# Patient Record
Sex: Male | Born: 1950 | Race: Black or African American | Hispanic: No | Marital: Married | Smoking: Current every day smoker
Health system: Southern US, Community
[De-identification: ages and names within clinical notes are randomized; demographics above are authoritative.]

## PROBLEM LIST (undated history)

## (undated) DIAGNOSIS — J45909 Unspecified asthma, uncomplicated: Secondary | ICD-10-CM

## (undated) DIAGNOSIS — K219 Gastro-esophageal reflux disease without esophagitis: Secondary | ICD-10-CM

## (undated) DIAGNOSIS — H269 Unspecified cataract: Secondary | ICD-10-CM

## (undated) DIAGNOSIS — M109 Gout, unspecified: Secondary | ICD-10-CM

## (undated) DIAGNOSIS — E785 Hyperlipidemia, unspecified: Secondary | ICD-10-CM

## (undated) DIAGNOSIS — J449 Chronic obstructive pulmonary disease, unspecified: Secondary | ICD-10-CM

## (undated) DIAGNOSIS — E119 Type 2 diabetes mellitus without complications: Secondary | ICD-10-CM

## (undated) DIAGNOSIS — I10 Essential (primary) hypertension: Secondary | ICD-10-CM

## (undated) DIAGNOSIS — I219 Acute myocardial infarction, unspecified: Secondary | ICD-10-CM

## (undated) DIAGNOSIS — M1990 Unspecified osteoarthritis, unspecified site: Secondary | ICD-10-CM

## (undated) DIAGNOSIS — R0902 Hypoxemia: Secondary | ICD-10-CM

## (undated) DIAGNOSIS — I519 Heart disease, unspecified: Secondary | ICD-10-CM

## (undated) DIAGNOSIS — E1110 Type 2 diabetes mellitus with ketoacidosis without coma: Secondary | ICD-10-CM

## (undated) HISTORY — DX: Unspecified osteoarthritis, unspecified site: M19.90

## (undated) HISTORY — DX: Acute myocardial infarction, unspecified: I21.9

## (undated) HISTORY — DX: Type 2 diabetes mellitus with ketoacidosis without coma: E11.10

## (undated) HISTORY — DX: Unspecified asthma, uncomplicated: J45.909

## (undated) HISTORY — DX: Hypoxemia: R09.02

## (undated) HISTORY — DX: Heart disease, unspecified: I51.9

## (undated) HISTORY — PX: COLONOSCOPY: SHX174

## (undated) HISTORY — PX: OTHER SURGICAL HISTORY: SHX169

## (undated) HISTORY — DX: Type 2 diabetes mellitus without complications: E11.9

## (undated) HISTORY — DX: Unspecified cataract: H26.9

## (undated) HISTORY — PX: NO PAST SURGERIES: SHX2092

## (undated) HISTORY — DX: Hyperlipidemia, unspecified: E78.5

## (undated) HISTORY — DX: Gastro-esophageal reflux disease without esophagitis: K21.9

---

## 2013-02-17 HISTORY — PX: COLONOSCOPY: SHX174

## 2015-01-08 ENCOUNTER — Emergency Department (HOSPITAL_COMMUNITY): Payer: Medicaid Other

## 2015-01-08 ENCOUNTER — Encounter (HOSPITAL_COMMUNITY): Payer: Self-pay | Admitting: Nurse Practitioner

## 2015-01-08 ENCOUNTER — Inpatient Hospital Stay (HOSPITAL_COMMUNITY)
Admission: EM | Admit: 2015-01-08 | Discharge: 2015-01-11 | DRG: 638 | Disposition: A | Discharge status: Home Health | Payer: Medicaid Other | Attending: Internal Medicine | Admitting: Internal Medicine

## 2015-01-08 DIAGNOSIS — I10 Essential (primary) hypertension: Secondary | ICD-10-CM | POA: Diagnosis present

## 2015-01-08 DIAGNOSIS — R0902 Hypoxemia: Secondary | ICD-10-CM | POA: Diagnosis present

## 2015-01-08 DIAGNOSIS — J449 Chronic obstructive pulmonary disease, unspecified: Secondary | ICD-10-CM | POA: Diagnosis present

## 2015-01-08 DIAGNOSIS — Z7982 Long term (current) use of aspirin: Secondary | ICD-10-CM

## 2015-01-08 DIAGNOSIS — M109 Gout, unspecified: Secondary | ICD-10-CM | POA: Diagnosis present

## 2015-01-08 DIAGNOSIS — A599 Trichomoniasis, unspecified: Secondary | ICD-10-CM | POA: Diagnosis present

## 2015-01-08 DIAGNOSIS — E876 Hypokalemia: Secondary | ICD-10-CM | POA: Diagnosis not present

## 2015-01-08 DIAGNOSIS — E1310 Other specified diabetes mellitus with ketoacidosis without coma: Principal | ICD-10-CM | POA: Diagnosis present

## 2015-01-08 DIAGNOSIS — R079 Chest pain, unspecified: Secondary | ICD-10-CM | POA: Diagnosis present

## 2015-01-08 DIAGNOSIS — N179 Acute kidney failure, unspecified: Secondary | ICD-10-CM | POA: Diagnosis present

## 2015-01-08 DIAGNOSIS — K219 Gastro-esophageal reflux disease without esophagitis: Secondary | ICD-10-CM | POA: Insufficient documentation

## 2015-01-08 DIAGNOSIS — I129 Hypertensive chronic kidney disease with stage 1 through stage 4 chronic kidney disease, or unspecified chronic kidney disease: Secondary | ICD-10-CM | POA: Diagnosis present

## 2015-01-08 DIAGNOSIS — R0789 Other chest pain: Secondary | ICD-10-CM | POA: Diagnosis present

## 2015-01-08 DIAGNOSIS — J438 Other emphysema: Secondary | ICD-10-CM | POA: Diagnosis not present

## 2015-01-08 DIAGNOSIS — N189 Chronic kidney disease, unspecified: Secondary | ICD-10-CM | POA: Diagnosis present

## 2015-01-08 DIAGNOSIS — J42 Unspecified chronic bronchitis: Secondary | ICD-10-CM | POA: Diagnosis not present

## 2015-01-08 DIAGNOSIS — E1110 Type 2 diabetes mellitus with ketoacidosis without coma: Secondary | ICD-10-CM | POA: Diagnosis present

## 2015-01-08 DIAGNOSIS — F17210 Nicotine dependence, cigarettes, uncomplicated: Secondary | ICD-10-CM | POA: Diagnosis present

## 2015-01-08 DIAGNOSIS — R109 Unspecified abdominal pain: Secondary | ICD-10-CM

## 2015-01-08 DIAGNOSIS — N178 Other acute kidney failure: Secondary | ICD-10-CM | POA: Diagnosis not present

## 2015-01-08 HISTORY — DX: Gastro-esophageal reflux disease without esophagitis: K21.9

## 2015-01-08 HISTORY — DX: Chronic obstructive pulmonary disease, unspecified: J44.9

## 2015-01-08 HISTORY — DX: Essential (primary) hypertension: I10

## 2015-01-08 HISTORY — DX: Acute kidney failure, unspecified: N17.9

## 2015-01-08 HISTORY — DX: Gout, unspecified: M10.9

## 2015-01-08 LAB — URINALYSIS, ROUTINE W REFLEX MICROSCOPIC
Glucose, UA: >1000 mg/dL — AB
Hgb urine dipstick: NEGATIVE
Ketones, ur: >80 mg/dL — AB
Nitrite: NEGATIVE
Protein, ur: NEGATIVE mg/dL
Specific Gravity, Urine: 1.022 (ref 1.005–1.030)
Urobilinogen, UA: 1 mg/dL (ref 0.0–1.0)
pH: 5 (ref 5.0–8.0)

## 2015-01-08 LAB — URINE MICROSCOPIC-ADD ON

## 2015-01-08 LAB — I-STAT VENOUS BLOOD GAS, ED
Acid-base deficit: 11 mmol/L — ABNORMAL HIGH (ref 0.0–2.0)
Bicarbonate: 14.8 mEq/L — ABNORMAL LOW (ref 20.0–24.0)
O2 Saturation: 74 %
TCO2: 16 mmol/L (ref 0–100)
pCO2, Ven: 32.7 mmHg — ABNORMAL LOW (ref 45.0–50.0)
pH, Ven: 7.262 (ref 7.250–7.300)
pO2, Ven: 45 mmHg (ref 30.0–45.0)

## 2015-01-08 LAB — I-STAT TROPONIN, ED: TROPONIN I, POC: 0 ng/mL (ref 0.00–0.08)

## 2015-01-08 LAB — BASIC METABOLIC PANEL
ANION GAP: 25 — AB (ref 5–15)
BUN: 27 mg/dL — AB (ref 6–20)
CO2: 12 mmol/L — ABNORMAL LOW (ref 22–32)
Calcium: 8.8 mg/dL — ABNORMAL LOW (ref 8.9–10.3)
Chloride: 94 mmol/L — ABNORMAL LOW (ref 101–111)
Creatinine, Ser: 1.86 mg/dL — ABNORMAL HIGH (ref 0.61–1.24)
GFR, EST AFRICAN AMERICAN: 43 mL/min — AB (ref >60)
GFR, EST NON AFRICAN AMERICAN: 37 mL/min — AB (ref >60)
GLUCOSE: 441 mg/dL — AB (ref 65–99)
Potassium: 4.4 mmol/L (ref 3.5–5.1)
Sodium: 131 mmol/L — ABNORMAL LOW (ref 135–145)

## 2015-01-08 LAB — GLUCOSE, CAPILLARY: Glucose-Capillary: 413 mg/dL — ABNORMAL HIGH (ref 65–99)

## 2015-01-08 LAB — CBC
HEMATOCRIT: 50 % (ref 39.0–52.0)
Hemoglobin: 17.1 g/dL — ABNORMAL HIGH (ref 13.0–17.0)
MCH: 28.1 pg (ref 26.0–34.0)
MCHC: 34.2 g/dL (ref 30.0–36.0)
MCV: 82.1 fL (ref 78.0–100.0)
Platelets: 113 10*3/uL — ABNORMAL LOW (ref 150–400)
RBC: 6.09 MIL/uL — ABNORMAL HIGH (ref 4.22–5.81)
RDW: 14.2 % (ref 11.5–15.5)
WBC: 7.7 10*3/uL (ref 4.0–10.5)

## 2015-01-08 LAB — CBG MONITORING, ED
GLUCOSE-CAPILLARY: 407 mg/dL — AB (ref 65–99)
Glucose-Capillary: 459 mg/dL — ABNORMAL HIGH (ref 65–99)

## 2015-01-08 LAB — BRAIN NATRIURETIC PEPTIDE: B NATRIURETIC PEPTIDE 5: 17.1 pg/mL (ref 0.0–100.0)

## 2015-01-08 MED ORDER — DEXTROSE-NACL 5-0.45 % IV SOLN
INTRAVENOUS | Status: DC
  Administered 2015-01-09: 01:00:00 via INTRAVENOUS

## 2015-01-08 MED ORDER — ASPIRIN 325 MG PO TABS
325.0000 mg | ORAL_TABLET | Freq: Every day | ORAL | Status: DC
Start: 2015-01-08 — End: 2015-01-11
  Administered 2015-01-09 – 2015-01-11 (×4): 325 mg via ORAL
  Filled 2015-01-08 (×4): qty 1

## 2015-01-08 MED ORDER — PANTOPRAZOLE SODIUM 40 MG PO TBEC
40.0000 mg | DELAYED_RELEASE_TABLET | Freq: Every day | ORAL | Status: DC
  Administered 2015-01-09 – 2015-01-11 (×3): 40 mg via ORAL
  Filled 2015-01-08 (×4): qty 1

## 2015-01-08 MED ORDER — SODIUM CHLORIDE 0.9 % IV SOLN
INTRAVENOUS | Status: DC
  Administered 2015-01-08: 4 [IU]/h via INTRAVENOUS
  Filled 2015-01-08: qty 2.5

## 2015-01-08 MED ORDER — DEXTROSE-NACL 5-0.45 % IV SOLN: INTRAVENOUS | Status: DC

## 2015-01-08 MED ORDER — NITROGLYCERIN 0.4 MG SL SUBL: 0.4000 mg | SUBLINGUAL_TABLET | SUBLINGUAL | Status: DC | PRN

## 2015-01-08 MED ORDER — SODIUM CHLORIDE 0.9 % IV SOLN: INTRAVENOUS | Status: DC

## 2015-01-08 MED ORDER — COLCHICINE 0.6 MG PO TABS
0.6000 mg | ORAL_TABLET | Freq: Every day | ORAL | Status: DC | PRN
  Filled 2015-01-08: qty 1

## 2015-01-08 MED ORDER — ENOXAPARIN SODIUM 40 MG/0.4ML ~~LOC~~ SOLN
40.0000 mg | Freq: Every day | SUBCUTANEOUS | Status: DC
  Administered 2015-01-09 – 2015-01-11 (×3): 40 mg via SUBCUTANEOUS
  Filled 2015-01-08 (×3): qty 0.4

## 2015-01-08 MED ORDER — MOMETASONE FURO-FORMOTEROL FUM 100-5 MCG/ACT IN AERO
2.0000 | INHALATION_SPRAY | Freq: Two times a day (BID) | RESPIRATORY_TRACT | Status: DC
  Administered 2015-01-09 – 2015-01-11 (×4): 2 via RESPIRATORY_TRACT
  Filled 2015-01-08 (×2): qty 8.8

## 2015-01-08 MED ORDER — SODIUM CHLORIDE 0.9 % IV SOLN
INTRAVENOUS | Status: DC
  Administered 2015-01-08 – 2015-01-10 (×3): via INTRAVENOUS

## 2015-01-08 MED ORDER — POTASSIUM CHLORIDE 10 MEQ/100ML IV SOLN
10.0000 meq | INTRAVENOUS | Status: AC
  Administered 2015-01-09 (×4): 10 meq via INTRAVENOUS
  Filled 2015-01-08 (×4): qty 100

## 2015-01-08 NOTE — ED Provider Notes (Signed)
CSN: [id number]     Arrival date & time 01/08/15  1704 History   First MD Initiated Contact with Patient 01/08/15 2000     Chief Complaint  Patient presents with  . Chest Pain     (Consider location/radiation/quality/duration/timing/severity/associated sxs/prior Treatment) HPI Patient presents to the emergency department with weakness, abdominal pain and chest discomfort over the past couple of weeks.  The patient states that he has been getting weaker and weaker. he states she has also had a lot of increased thirst and increased urination.  Patient states that nothing seems make his condition, better or worse.  The patient states that he does not have any chest pain, shortness of breath, nausea, vomiting, diarrhea, headache, blurred vision, back pain, neck pain, dysuria, incontinence, rash, or syncope.  The patient states that he does not have any history of diabetes Past Medical History  Diagnosis Date  . Acid reflux   . Hypertension    History reviewed. No pertinent past surgical history. History reviewed. No pertinent family history. History  Substance Use Topics  . Smoking status: Current Every Day Smoker    Types: Cigarettes  . Smokeless tobacco: Not on file  . Alcohol Use: Yes    Review of Systems  All other systems negative except as documented in the HPI. All pertinent positives and negatives as reviewed in the HPI.  Allergies  Review of patient's allergies indicates no known allergies.  Home Medications   Prior to Admission medications   Not on File   BP 134/68 mmHg  Pulse 64  Temp(Src) 97.3 F (36.3 C) (Oral)  Resp 20  SpO2 99% Physical Exam  Constitutional: He is oriented to person, place, and time. He appears well-developed and well-nourished. No distress.  HENT:  Head: Normocephalic and atraumatic.  Mouth/Throat: Mucous membranes are dry.  Eyes: Pupils are equal, round, and reactive to light.  Cardiovascular: Normal rate, regular rhythm and normal  heart sounds.  Exam reveals no gallop and no friction rub.   No murmur heard. Pulmonary/Chest: Effort normal and breath sounds normal. No respiratory distress. He has no wheezes.  Abdominal: Soft. Bowel sounds are normal. He exhibits no distension. There is no tenderness.  Neurological: He is alert and oriented to person, place, and time. He exhibits normal muscle tone. Coordination normal.  Skin: Skin is warm and dry. No rash noted. No erythema.  Nursing note and vitals reviewed.   ED Course  Procedures (including critical care time) Labs Review Labs Reviewed  BASIC METABOLIC PANEL - Abnormal; Notable for the following:    Sodium 131 (*)    Chloride 94 (*)    CO2 12 (*)    Glucose, Bld 441 (*)    BUN 27 (*)    Creatinine, Ser 1.86 (*)    Calcium 8.8 (*)    GFR calc non Af Amer 37 (*)    GFR calc Af Amer 43 (*)    Anion gap 25 (*)    All other components within normal limits  CBC - Abnormal; Notable for the following:    RBC 6.09 (*)    Hemoglobin 17.1 (*)    Platelets 113 (*)    All other components within normal limits  BRAIN NATRIURETIC PEPTIDE  I-STAT TROPOININ, ED    Imaging Review Dg Chest 2 View  01/08/2015   CLINICAL DATA:  Midline chest pain and shortness of breath for 1 1/2 weeks.  EXAM: CHEST  2 VIEW  COMPARISON:  None.  FINDINGS: The heart  is normal in size. There is mild tortuosity of the thoracic aorta. The pulmonary hila appear normal. There are chronic appearing bronchitic type interstitial lung changes and probable bibasilar scarring or subsegmental atelectasis. No infiltrates, edema or effusions. The bony thorax is intact.  IMPRESSION: Chronic appearing bronchitic type lung changes and bibasilar scarring or atelectasis. No infiltrates or effusions.   Electronically Signed   By: Rudie MeyerP.  Gallerani M.D.   On: 01/08/2015 18:09     EKG Interpretation None      Patient has diabetic ketoacidosis.  The patient will be added to the hospital for further evaluation and  care.  Glucose stabilizer was started along with IV fluids.  Patient is rechecked [address]    Charlestine NightChristopher Zayaan Kozak, PA-C 01/11/15 1351  Gilda Creasehristopher J Pollina, MD 01/14/15 1414

## 2015-01-08 NOTE — ED Notes (Signed)
Kakrakandy, MD at bedside 

## 2015-01-08 NOTE — ED Notes (Signed)
Attempted report 

## 2015-01-08 NOTE — ED Notes (Addendum)
CBG 407 

## 2015-01-08 NOTE — H&P (Addendum)
[facility] History and Physical  Previn Flinn ZOX:[id number] DOB: 1950/12/30 DOA: 01/08/2015  Referring physician: Mr.Lawyer. PCP: No primary care provider on file. Dr.Campbell in [place]. Specialists: None.  Chief Complaint: Weakness and chest pain.  HPI: Scott Figueroa is a 64 y.o. male with history of COPD, ongoing tobacco abuse, hypertension and gout presents to the ER because of weakness and chest pain. Patient states over the last 1 week patient has been feeling with a week and has been having some exertional chest pain. Chest pain is retrosternal and pressure-like and increases on walking. Patient states he has chronic shortness of breath. Patient states he also has been having some epigastric discomfort with nausea and poor oral intake over the last 1 week. In the ER patient's initial metabolic panel shows elevated anion gap with blood sugar consistent with DKA. Patient has been admitted for further management of DKA chest pain. Patient is nonfocal on exam.   Review of Systems: As presented in the history of presenting illness, rest negative.  Past Medical History  Diagnosis Date  . Acid reflux   . Hypertension   . COPD (chronic obstructive pulmonary disease)   . Gout    Past Surgical History  Procedure Laterality Date  . No past surgeries     Social History:  reports that he has been smoking Cigarettes.  He does not have any smokeless tobacco history on file. He reports that he drinks alcohol. He reports that he does not use illicit drugs. Where does patient live home. Can patient participate in ADLs? Yes.  No Known Allergies  Family History:  Family History  Problem Relation Age of Onset  . Diabetes Mellitus II Mother   . Hypertension Mother   . Heart failure Father   . Hypertension Father       Prior to Admission medications   Medication Sig Start Date End Date Taking? Authorizing Provider  benazepril-hydrochlorthiazide (LOTENSIN HCT) 20-25 MG per tablet  Take 1 tablet by mouth daily.   Yes Historical Provider, MD  colchicine 0.6 MG tablet Take 0.6 mg by mouth daily as needed (gout).   Yes Historical Provider, MD  Fluticasone-Salmeterol (ADVAIR) 250-50 MCG/DOSE AEPB Inhale 1 puff into the lungs 2 (two) times daily.   Yes Historical Provider, MD  omeprazole (PRILOSEC) 20 MG capsule Take 20 mg by mouth daily.   Yes Historical Provider, MD    Physical Exam: Filed Vitals:   01/08/15 2130 01/08/15 2145 01/08/15 2215 01/08/15 2230  BP: 132/65 132/66 134/67 152/60  Pulse: 67 68 80 81  Temp:      TempSrc:      Resp: Height:      Weight:      SpO2: 100% 98% 98% 94%     General:  Moderately built and nourished.  Eyes: Anicteric no pallor.  ENT: No discharge from the ears eyes nose and mouth.  Neck: No mass felt. No JVD appreciated.  Cardiovascular: S1 and S2 heard.  Respiratory: No rhonchi or crepitations.  Abdomen: Mild epigastric tenderness no guarding or rigidity.  Skin: No rash.  Musculoskeletal: No edema.  Psychiatric: Appears normal.  Neurologic: Alert awake oriented to time place and person. Moves all extremities.  Labs on Admission:  Basic Metabolic Panel:  Recent Labs Lab 01/08/15 1719  NA 131*  K 4.4  CL 94*  CO2 12*  GLUCOSE 441*  BUN 27*  CREATININE 1.86*  CALCIUM 8.8*   Liver Function Tests: No results for input(s): AST,  ALT, ALKPHOS, BILITOT, PROT, ALBUMIN in the last 168 hours. No results for input(s): LIPASE, AMYLASE in the last 168 hours. No results for input(s): AMMONIA in the last 168 hours. CBC:  Recent Labs Lab 01/08/15 1719  WBC 7.7  HGB 17.1*  HCT 50.0  MCV 82.1  PLT 113*   Cardiac Enzymes: No results for input(s): CKTOTAL, CKMB, CKMBINDEX, TROPONINI in the last 168 hours.  BNP (last 3 results)  Recent Labs  01/08/15 1719  BNP 17.1    ProBNP (last 3 results) No results for input(s): PROBNP in the last 8760 hours.  CBG:  Recent Labs Lab 01/08/15 2053  01/08/15 2212  GLUCAP 459* 407*    Radiological Exams on Admission: Dg Chest 2 View  01/08/2015   CLINICAL DATA:  Midline chest pain and shortness of breath for 1 1/2 weeks.  EXAM: CHEST  2 VIEW  COMPARISON:  None.  FINDINGS: The heart is normal in size. There is mild tortuosity of the thoracic aorta. The pulmonary hila appear normal. There are chronic appearing bronchitic type interstitial lung changes and probable bibasilar scarring or subsegmental atelectasis. No infiltrates, edema or effusions. The bony thorax is intact.  IMPRESSION: Chronic appearing bronchitic type lung changes and bibasilar scarring or atelectasis. No infiltrates or effusions.   Electronically Signed   By: Rudie MeyerP.  Gallerani M.D.   On: 01/08/2015 18:09    EKG: Independently reviewed. Normal sinus rhythm with prolonged QT and LVH.  Assessment/Plan Active Problems:   DKA (diabetic ketoacidoses)   Chest pain   ARF (acute renal failure)   COPD (chronic obstructive pulmonary disease)   Hypertension   DKA, type 2   1. Diabetic ketoacidosis probably type 2 diabetes - patient has been started on IV insulin infusion and IV fluids. Change to long-acting insulin subcutaneous once anion gap is corrected. Closely follow metabolic panel. DKA probably secondary to new onset diabetes. 2. Chest pain with exertional symptoms concerning for angina - cycle cardiac markers and check 2-D echo. Aspirin. 3. Abdominal pain - check LFTs and lipase and sonogram of the abdomen. 4. Renal failure probably acute on chronic - baseline creatinine not known. At this time we will hold off ARB's and HCTZ. Gently hydrate him closely follow metabolic panel. 5. Hypertension - since patient is nothing by mouth and secondary to #4 were holding off patient's ARB and HCTZ and patient has been placed on when necessary IV hydralazine. Closely follow blood pressure trends. 6. COPD - continue home inhalers. 7. History of gout on allopurinol. 8. Tobacco abuse -  patient advised to quit smoking. 9. Trichomoniasis in the urine - Flagyl has been ordered and patient advised will need treatment for his partner. Follow urine culture since there is a lot of WBC in the urine.   DVT Prophylaxis Lovenox.  Code Status: Full code.  Family Communication: Patient's sister at the bedside.  Disposition Plan: Admit to inpatient.    KAKRAKANDY,ARSHAD N. [facility] Pager [phone].  If 7PM-7AM, please contact night-coverage [email or web address] Password [facility] 01/08/2015, 11:08 PM

## 2015-01-08 NOTE — ED Notes (Signed)
He states hes been weak and had CP and abd pain this week. He went to his doctor earlier this week and was started on medication for acid reflux but states his pain persists and he continues to feel bad. He reports nausea, sob also. ghe is A&ox4, breathing easily now

## 2015-01-09 ENCOUNTER — Inpatient Hospital Stay (HOSPITAL_COMMUNITY): Payer: Medicaid Other

## 2015-01-09 DIAGNOSIS — E1310 Other specified diabetes mellitus with ketoacidosis without coma: Principal | ICD-10-CM

## 2015-01-09 DIAGNOSIS — N178 Other acute kidney failure: Secondary | ICD-10-CM

## 2015-01-09 DIAGNOSIS — I10 Essential (primary) hypertension: Secondary | ICD-10-CM

## 2015-01-09 DIAGNOSIS — R079 Chest pain, unspecified: Secondary | ICD-10-CM

## 2015-01-09 LAB — GLUCOSE, CAPILLARY
GLUCOSE-CAPILLARY: 170 mg/dL — AB (ref 65–99)
GLUCOSE-CAPILLARY: 171 mg/dL — AB (ref 65–99)
GLUCOSE-CAPILLARY: 159 mg/dL — AB (ref 65–99)
GLUCOSE-CAPILLARY: 159 mg/dL — AB (ref 65–99)
GLUCOSE-CAPILLARY: 106 mg/dL — AB (ref 65–99)
GLUCOSE-CAPILLARY: 235 mg/dL — AB (ref 65–99)
GLUCOSE-CAPILLARY: 170 mg/dL — AB (ref 65–99)
Glucose-Capillary: 169 mg/dL — ABNORMAL HIGH (ref 65–99)
Glucose-Capillary: 182 mg/dL — ABNORMAL HIGH (ref 65–99)
Glucose-Capillary: 189 mg/dL — ABNORMAL HIGH (ref 65–99)
Glucose-Capillary: 176 mg/dL — ABNORMAL HIGH (ref 65–99)
Glucose-Capillary: 179 mg/dL — ABNORMAL HIGH (ref 65–99)
Glucose-Capillary: 147 mg/dL — ABNORMAL HIGH (ref 65–99)
Glucose-Capillary: 122 mg/dL — ABNORMAL HIGH (ref 65–99)
Glucose-Capillary: 91 mg/dL (ref 65–99)
Glucose-Capillary: 135 mg/dL — ABNORMAL HIGH (ref 65–99)
Glucose-Capillary: 199 mg/dL — ABNORMAL HIGH (ref 65–99)
Glucose-Capillary: 231 mg/dL — ABNORMAL HIGH (ref 65–99)
Glucose-Capillary: 218 mg/dL — ABNORMAL HIGH (ref 65–99)

## 2015-01-09 LAB — HEPATIC FUNCTION PANEL
ALBUMIN: 3.7 g/dL (ref 3.5–5.0)
ALK PHOS: 146 U/L — AB (ref 38–126)
ALT: 69 U/L — ABNORMAL HIGH (ref 17–63)
AST: 57 U/L — ABNORMAL HIGH (ref 15–41)
BILIRUBIN DIRECT: 0.3 mg/dL (ref 0.1–0.5)
BILIRUBIN TOTAL: 1.6 mg/dL — AB (ref 0.3–1.2)
Indirect Bilirubin: 1.3 mg/dL — ABNORMAL HIGH (ref 0.3–0.9)
Total Protein: 7.5 g/dL (ref 6.5–8.1)

## 2015-01-09 LAB — CBC WITH DIFFERENTIAL/PLATELET
Basophils Absolute: 0 10*3/uL (ref 0.0–0.1)
Basophils Relative: 0 % (ref 0–1)
EOS ABS: 0.1 10*3/uL (ref 0.0–0.7)
EOS PCT: 1 % (ref 0–5)
HEMATOCRIT: 46.3 % (ref 39.0–52.0)
Hemoglobin: 16 g/dL (ref 13.0–17.0)
LYMPHS PCT: 38 % (ref 12–46)
Lymphs Abs: 3.4 10*3/uL (ref 0.7–4.0)
MCH: 28.1 pg (ref 26.0–34.0)
MCHC: 34.6 g/dL (ref 30.0–36.0)
MCV: 81.2 fL (ref 78.0–100.0)
Monocytes Absolute: 0.9 10*3/uL (ref 0.1–1.0)
Monocytes Relative: 10 % (ref 3–12)
Neutro Abs: 4.4 10*3/uL (ref 1.7–7.7)
Neutrophils Relative %: 50 % (ref 43–77)
PLATELETS: 111 10*3/uL — AB (ref 150–400)
RBC: 5.7 MIL/uL (ref 4.22–5.81)
RDW: 14.1 % (ref 11.5–15.5)
WBC: 8.8 10*3/uL (ref 4.0–10.5)

## 2015-01-09 LAB — BASIC METABOLIC PANEL
ANION GAP: 20 — AB (ref 5–15)
Anion gap: 25 — ABNORMAL HIGH (ref 5–15)
Anion gap: 17 — ABNORMAL HIGH (ref 5–15)
Anion gap: 16 — ABNORMAL HIGH (ref 5–15)
Anion gap: 14 (ref 5–15)
Anion gap: 14 (ref 5–15)
BUN: 14 mg/dL (ref 6–20)
BUN: 18 mg/dL (ref 6–20)
BUN: 18 mg/dL (ref 6–20)
BUN: 22 mg/dL — AB (ref 6–20)
BUN: 26 mg/dL — ABNORMAL HIGH (ref 6–20)
BUN: 28 mg/dL — AB (ref 6–20)
CALCIUM: 8.9 mg/dL (ref 8.9–10.3)
CHLORIDE: 97 mmol/L — AB (ref 101–111)
CHLORIDE: 100 mmol/L — AB (ref 101–111)
CO2: 13 mmol/L — ABNORMAL LOW (ref 22–32)
CO2: 16 mmol/L — ABNORMAL LOW (ref 22–32)
CO2: 19 mmol/L — ABNORMAL LOW (ref 22–32)
CO2: 19 mmol/L — ABNORMAL LOW (ref 22–32)
CO2: 20 mmol/L — AB (ref 22–32)
CO2: 20 mmol/L — ABNORMAL LOW (ref 22–32)
CREATININE: 1.24 mg/dL (ref 0.61–1.24)
CREATININE: 1.75 mg/dL — AB (ref 0.61–1.24)
Calcium: 9.4 mg/dL (ref 8.9–10.3)
Calcium: 8.9 mg/dL (ref 8.9–10.3)
Calcium: 8.9 mg/dL (ref 8.9–10.3)
Calcium: 8.6 mg/dL — ABNORMAL LOW (ref 8.9–10.3)
Calcium: 8.7 mg/dL — ABNORMAL LOW (ref 8.9–10.3)
Chloride: 97 mmol/L — ABNORMAL LOW (ref 101–111)
Chloride: 99 mmol/L — ABNORMAL LOW (ref 101–111)
Chloride: 96 mmol/L — ABNORMAL LOW (ref 101–111)
Chloride: 97 mmol/L — ABNORMAL LOW (ref 101–111)
Creatinine, Ser: 1.67 mg/dL — ABNORMAL HIGH (ref 0.61–1.24)
Creatinine, Ser: 1.35 mg/dL — ABNORMAL HIGH (ref 0.61–1.24)
Creatinine, Ser: 1.32 mg/dL — ABNORMAL HIGH (ref 0.61–1.24)
Creatinine, Ser: 1.22 mg/dL (ref 0.61–1.24)
GFR calc Af Amer: 49 mL/min — ABNORMAL LOW (ref >60)
GFR calc Af Amer: >60 mL/min (ref >60)
GFR calc Af Amer: >60 mL/min (ref >60)
GFR calc Af Amer: >60 mL/min (ref >60)
GFR calc non Af Amer: 42 mL/min — ABNORMAL LOW (ref >60)
GFR calc non Af Amer: 56 mL/min — ABNORMAL LOW (ref >60)
GFR calc non Af Amer: >60 mL/min (ref >60)
GFR, EST AFRICAN AMERICAN: 46 mL/min — AB (ref >60)
GFR, EST NON AFRICAN AMERICAN: 40 mL/min — AB (ref >60)
GFR, EST NON AFRICAN AMERICAN: 55 mL/min — AB (ref >60)
GLUCOSE: 173 mg/dL — AB (ref 65–99)
GLUCOSE: 120 mg/dL — AB (ref 65–99)
GLUCOSE: 239 mg/dL — AB (ref 65–99)
GLUCOSE: 190 mg/dL — AB (ref 65–99)
Glucose, Bld: 230 mg/dL — ABNORMAL HIGH (ref 65–99)
Glucose, Bld: 164 mg/dL — ABNORMAL HIGH (ref 65–99)
POTASSIUM: 3.5 mmol/L (ref 3.5–5.1)
POTASSIUM: 3.6 mmol/L (ref 3.5–5.1)
POTASSIUM: 3.8 mmol/L (ref 3.5–5.1)
POTASSIUM: 4 mmol/L (ref 3.5–5.1)
Potassium: 3.7 mmol/L (ref 3.5–5.1)
Potassium: 3.4 mmol/L — ABNORMAL LOW (ref 3.5–5.1)
SODIUM: 131 mmol/L — AB (ref 135–145)
Sodium: 135 mmol/L (ref 135–145)
Sodium: 133 mmol/L — ABNORMAL LOW (ref 135–145)
Sodium: 135 mmol/L (ref 135–145)
Sodium: 135 mmol/L (ref 135–145)
Sodium: 130 mmol/L — ABNORMAL LOW (ref 135–145)

## 2015-01-09 LAB — LIPASE, BLOOD: LIPASE: 58 U/L — AB (ref 22–51)

## 2015-01-09 LAB — RAPID URINE DRUG SCREEN, HOSP PERFORMED
Amphetamines: NOT DETECTED
BENZODIAZEPINES: NOT DETECTED
Barbiturates: NOT DETECTED
COCAINE: NOT DETECTED
Opiates: NOT DETECTED
TETRAHYDROCANNABINOL: NOT DETECTED

## 2015-01-09 LAB — MAGNESIUM: Magnesium: 1.9 mg/dL (ref 1.7–2.4)

## 2015-01-09 LAB — TROPONIN I
Troponin I: <0.03 ng/mL (ref <0.031)
Troponin I: <0.03 ng/mL (ref <0.031)
Troponin I: 0.03 ng/mL (ref <0.031)

## 2015-01-09 LAB — CK: Total CK: 110 U/L (ref 49–397)

## 2015-01-09 LAB — MRSA PCR SCREENING: MRSA BY PCR: NEGATIVE

## 2015-01-09 MED ORDER — HYDRALAZINE HCL 20 MG/ML IJ SOLN: 10.0000 mg | INTRAMUSCULAR | Status: DC | PRN

## 2015-01-09 MED ORDER — LIVING WELL WITH DIABETES BOOK
Freq: Once | Status: AC
  Administered 2015-01-09: 1
  Filled 2015-01-09 (×2): qty 1

## 2015-01-09 MED ORDER — ALBUTEROL SULFATE (2.5 MG/3ML) 0.083% IN NEBU: 2.5000 mg | INHALATION_SOLUTION | Freq: Four times a day (QID) | RESPIRATORY_TRACT | Status: DC | PRN

## 2015-01-09 MED ORDER — ZOLPIDEM TARTRATE 5 MG PO TABS
5.0000 mg | ORAL_TABLET | Freq: Every evening | ORAL | Status: DC | PRN
  Administered 2015-01-09 – 2015-01-10 (×3): 5 mg via ORAL
  Filled 2015-01-09 (×3): qty 1

## 2015-01-09 MED ORDER — POTASSIUM CHLORIDE 10 MEQ/100ML IV SOLN
10.0000 meq | INTRAVENOUS | Status: AC
  Administered 2015-01-09 (×3): 10 meq via INTRAVENOUS
  Filled 2015-01-09: qty 100

## 2015-01-09 MED ORDER — POTASSIUM CHLORIDE CRYS ER 20 MEQ PO TBCR
40.0000 meq | EXTENDED_RELEASE_TABLET | Freq: Once | ORAL | Status: AC
Start: 2015-01-09 — End: 2015-01-09
  Administered 2015-01-09: 40 meq via ORAL
  Filled 2015-01-09: qty 2

## 2015-01-09 MED ORDER — METRONIDAZOLE IN NACL 5-0.79 MG/ML-% IV SOLN
500.0000 mg | Freq: Three times a day (TID) | INTRAVENOUS | Status: DC
Start: 2015-01-09 — End: 2015-01-10
  Administered 2015-01-09 – 2015-01-10 (×4): 500 mg via INTRAVENOUS
  Filled 2015-01-09 (×6): qty 100

## 2015-01-09 NOTE — Progress Notes (Signed)
  Echocardiogram 2D Echocardiogram has been performed.  Arvil ChacoFoster, Loy Mccartt 01/09/2015, 2:57 PM

## 2015-01-09 NOTE — Progress Notes (Signed)
Paged by nurse, patient had a 7 runs of nonsustained V. tach at 13:05. He was sleeping in his bed, asymptomatic. Supplement given for low potassium. Pending magnesium level. we will continue to monitor.

## 2015-01-09 NOTE — Progress Notes (Signed)
Utilization review completed. Tayquan Gassman, RN, BSN. 

## 2015-01-09 NOTE — Consult Note (Signed)
CARDIOLOGY CONSULT NOTE   Patient Scott Figueroa MRN: [id number] DOB/AGE: [birth year] 64 y.o.  Admit date: 01/08/2015  Primary Physician   Dr.Campbell in [place]. Primary Cardiologist   New (Dr. Herbie [place]) Reason for Consultation   Chest pain  HPI: Scott Figueroa is a 64 y.o. male with a history of HTN, COPD, ongoing tobacco abuse and gout presented to the [facility] ER 6/28 for worsening weakness and chest/abdominal pain.   Patient states over the last 1 month or so has been feeling with a week, recently worsen over last 1 week. He went to his doctor earlier this week for abdominal pain and was started on medication for acid reflux but states his pain persists and he continues to feel bad. He reports nausea and vomiting. He states he lost about 30lb in past 2 months. On presented to ED and IM he reported chest pain is retrosternal and pressure-like and increases on walking. However he told today today that he has not chest pain. He was complaining of abdominal pain has been worsen recently and describes this as burning achy pain over epigastric area. No radiation of pain. Nothing makes better or worse. His appetite has been decrease. He is complaining of exertional sob. He denies orthopnea, PND, LE swelling or claudication symptoms.   In the ER patient's initial metabolic panel shows elevated anion gap with blood sugar consistent with DKA. Patient has been admitted for further management of DKA. Cardiology is consulted for evaluation of cardiac etiology of his symptoms.    Past Medical History  Diagnosis Date  . Acid reflux   . Hypertension   . COPD (chronic obstructive pulmonary disease)   . Gout      Past Surgical History  Procedure Laterality Date  . No past surgeries      No Known Allergies  I have reviewed the patient's current medications . aspirin  325 mg Oral Daily  . enoxaparin (LOVENOX) injection  40 mg Subcutaneous Daily  . metronidazole  500 mg Intravenous 3 times per day   . mometasone-formoterol  2 puff Inhalation BID  . pantoprazole  40 mg Oral Daily  . potassium chloride  10 mEq Intravenous Q1 Hr x 3   . sodium chloride 150 mL/hr at 01/08/15 2337  . dextrose 5 % and 0.45% NaCl 125 mL/hr at 01/09/15 0048  . insulin (NOVOLIN-R) infusion 3.8 Units/hr (01/09/15 0651)   albuterol, colchicine, hydrALAZINE, nitroGLYCERIN, zolpidem  Prior to Admission medications   Medication Sig Start Date End Date Taking? Authorizing Provider  benazepril-hydrochlorthiazide (LOTENSIN HCT) 20-25 MG per tablet Take 1 tablet by mouth daily.   Yes Historical Provider, MD  colchicine 0.6 MG tablet Take 0.6 mg by mouth daily as needed (gout).   Yes Historical Provider, MD  Fluticasone-Salmeterol (ADVAIR) 250-50 MCG/DOSE AEPB Inhale 1 puff into the lungs 2 (two) times daily.   Yes Historical Provider, MD  omeprazole (PRILOSEC) 20 MG capsule Take 20 mg by mouth daily.   Yes Historical Provider, MD     History   Social History  . Marital Status: Married    Spouse Name: N/A  . Number of Children: N/A  . Years of Education: N/A   Occupational History  . Not on file.   Social History Main Topics  . Smoking status: Current Every Day Smoker    Types: Cigarettes  . Smokeless tobacco: Not on file  . Alcohol Use: Yes  . Drug Use: No  . Sexual Activity: Not on file   Other  Topics Concern  . Not on file   Social History Narrative  . No narrative on file    No family status information on file.   Family History  Problem Relation Age of Onset  . Diabetes Mellitus II Mother   . Hypertension Mother   . Heart failure Father   . Hypertension Father      ROS:  Full 14 point review of systems complete and found to be negative unless listed above.  Physical Exam: Blood pressure 122/78, pulse 70, temperature 97.9 F (36.6 C), temperature source Oral, resp. rate 20, height 5\' 11"  (1.803 m), weight 219 lb 2.2 oz (99.4 kg), SpO2 98 %.  General: ill apperaring, male in no acute  distress Head: Eyes PERRLA, No xanthomas. Normocephalic and atraumatic, oropharynx without edema or exudate.  Lungs: Resp regular and unlabored, CTA. Heart: RRR no s3, s4, or murmurs..   Neck: No carotid bruits. No lymphadenopathy.  No JVD. Abdomen: Bowel sounds present, abdomen soft and diffuse TTP.  Msk:  No spine or cva tenderness.  No joint deformities or effusions. Extremities: No clubbing, cyanosis or edema. DP/PT/Radials 2+ and equal bilaterally. Neuro: Alert and oriented X 3. No focal deficits noted. Psych:  Good affect, responds appropriately Skin: No rashes or lesions noted.  Labs:   Lab Results  Component Value Date   WBC 8.8 01/09/2015   HGB 16.0 01/09/2015   HCT 46.3 01/09/2015   MCV 81.2 01/09/2015   PLT 111* 01/09/2015   No results for input(s): INR in the last 72 hours.  Recent Labs Lab 01/09/15 0036  01/09/15 0800  NA 135  < > 135  K 4.0  < > 3.5  CL 97*  < > 99*  CO2 13*  < > 19*  BUN 28*  < > 22*  CREATININE 1.75*  < > 1.35*  CALCIUM 9.4  < > 8.9  PROT 7.5  --   --   BILITOT 1.6*  --   --   ALKPHOS 146*  --   --   ALT 69*  --   --   AST 57*  --   --   GLUCOSE 230*  < > 173*  ALBUMIN 3.7  --   --   < > = values in this interval not displayed. No results found for: MG  Recent Labs  01/09/15 0036 01/09/15 0528  CKTOTAL 110  --   TROPONINI <0.03 <0.03    Recent Labs  01/08/15 1726  TROPIPOC 0.00    LIPASE  Date/Time Value Ref Range Status  01/09/2015 12:36 AM 58* 22 - 51 U/L Final    Echo: pending  ECG:  NSR at rate of 71, LVH, prolonged QT  Radiology:  Dg Chest 2 View  01/08/2015   CLINICAL DATA:  Midline chest pain and shortness of breath for 1 1/2 weeks.  EXAM: CHEST  2 VIEW  COMPARISON:  None.  FINDINGS: The heart is normal in size. There is mild tortuosity of the thoracic aorta. The pulmonary hila appear normal. There are chronic appearing bronchitic type interstitial lung changes and probable bibasilar scarring or subsegmental  atelectasis. No infiltrates, edema or effusions. The bony thorax is intact.  IMPRESSION: Chronic appearing bronchitic type lung changes and bibasilar scarring or atelectasis. No infiltrates or effusions.   Electronically Signed   By: Rudie MeyerP.  Gallerani M.D.   On: 01/08/2015 18:09   [place] Abdomen Complete  01/09/2015   CLINICAL DATA:  Epigastric abdominal pain for the past 10  days  EXAM: ULTRASOUND ABDOMEN COMPLETE  COMPARISON:  None.  FINDINGS: Gallbladder: No gallstones or wall thickening visualized. No sonographic Murphy sign noted.  Common bile duct: Diameter: 3.5 mm  Liver: The hepatic echotexture is mildly increased. Adjacent to the gallbladder there is a 2.6 x 2.2 x 1.9 cm focus of decreased echogenicity. This may reflect focal fatty sparing. There is no intrahepatic ductal dilation.  IVC: No abnormality visualized.  Pancreas: Visualized portion unremarkable.  Spleen: Size and appearance within normal limits.  Right Kidney: Length: 13.4 cm. Echogenicity within normal limits. No mass or hydronephrosis visualized.  Left Kidney: Length: 12.4 cm. Echogenicity within normal limits. No mass or hydronephrosis visualized.  Abdominal aorta: No aneurysm visualized.  Other findings: No free fluid  IMPRESSION: 1. Fatty infiltrative change of the liver. Probable focal fatty sparing adjacent to the gallbladder fossa. 2. There is no evidence of acute cholecystitis. If there remain clinical concerns of gallbladder dysfunction, a nuclear medicine hepatobiliary scan would be useful. 3. No acute abnormality is observed elsewhere within the abdomen.   Electronically Signed   By: David  [place] M.D.   On: 01/09/2015 10:25    ASSESSMENT AND PLAN:     1. Atypical chest pain - Very atypical in nature. Likely GI etiology. No substernal chest pain. Top x 1 negative. EKG without acute abnormality, no prior EKG to compare. - Echo pending. No ischemic workup unless abnormal echo. - continue cycle trop  - Continue ASA - Recommended  lipid panel and TSH  2. Exertional dyspnea - Denies orthopnea or PND. likely due to COPD and ongoing tobacco smoking.  3. HTN - Held ARB and HCTZ due to AKI. BP stable. Continue to monitor - PRN hydralazine - No BB in setting of COPD  4. DKI with newly diagnosed diabetes - management per MD.   5. Abdominal pain/ GERD - Lipase 58, with elevated LFT - Pending hep C and HIV result - Consider GI consult  6. Acute kidney injury -  Held ARB and HCTZ. Creatinine improved to 1.35 from 1.75 on hydration.  - Daily Bmet  7. Tobacco abuse - Encouraged to stop smoking   Signed: Bhagat,Bhavinkumar, PA 01/09/2015, 10:31 AM Pager [phone]  Co-Sign MD  ATTENDING ATTESTATION  I have seen, examined and evaluated the patient this AM along with Mr. Sharrell Ku, [place] .  After reviewing all the available data and chart,  I agree with his findings, examination as well as impression recommendations.  64 y/o admitted with nausea, abdominal / epigastric pain with "DKA" - new Dx of DM.  Symptoms do not sound cardiac - but he is a very poor historian.  Troponin negative & non-ischemic EKG. CV exam is Benign  Would await results of Echo - if "norm", would not recommend further cardiac evaluation.   Marykay Lex, M.D., M.S. Interventional Cardiologist   Pager # [phone]

## 2015-01-09 NOTE — Progress Notes (Signed)
[facility] PROGRESS NOTE  Scott Figueroa BWG:[id number] DOB: April 17, 1951 DOA: 01/08/2015 PCP: No primary care provider on file.  Assessment/Plan: 1-DKA, New diagnosis of diabetes.  Continue with insulin gtt, IV fluids.  Awaiting B-met for this morning.  Diabetes educator consulted.  Transition to Lantus when gap close.   2-Chest pain with exertional symptoms concerning for angina - troponin times one negative.   2-D echo pending. . Aspirin. Cardiology consulted.   Abdominal pain - mild elevated LFT. [place] pending. Continue with Protonix. Check hepatitis panel.   Renal failure probably acute on chronic - baseline creatinine not known. Continue to hold off ARB's and HCTZ. Continue with IV fluids. Strict I and o.   Hypertension -  Hold ARB and HCTZ in setting or renal failure. PRN hydralazine.   COPD - continue home inhalers. Albuterol PRN.   History of gout. colchicine PR.   Tobacco abuse - patient advised to quit smoking.  Trichomoniasis in the urine - Continue with Flagyl. Check HIV.     Code Status: Full Code.  Family Communication: Care discussed with patient.  Disposition Plan: Remain in the step down,. Suspect home at time of discharge   Consultants:  Cardiology  Procedures:  Abdominal US; pending  ECHO.   Antibiotics:  Others flagyl.  HPI/Subjective: Feeling better, chest pain better./  Still with mild epigastric pain.  No prior history of diabetes or renal failure.    Objective: Filed Vitals:   01/09/15 0431  BP:   Pulse:   Temp: 97.9 F (36.6 C)  Resp:     Intake/Output Summary (Last 24 hours) at 01/09/15 0716 Last data filed at 01/09/15 0348  Gross per 24 hour  Intake 337.23 ml  Output    400 ml  Net -62.77 ml   Filed Weights   01/08/15 2111 01/08/15 2300  Weight: 103.42 kg (228 lb) 99.4 kg (219 lb 2.2 oz)    Exam:   General:  Alert in no distress.   Cardiovascular: S 1, S 2 RRR  Respiratory: CTA  Abdomen: BS present, soft,  nt  Musculoskeletal: trace edema.   Data Reviewed: Basic Metabolic Panel:  Recent Labs Lab 01/08/15 1719 01/09/15 0036 01/09/15 0120  NA 131* 135 133*  K 4.4 4.0 3.7  CL 94* 97* 97*  CO2 12* 13* 16*  GLUCOSE 441* 230* 164*  BUN 27* 28* 26*  CREATININE 1.86* 1.75* 1.67*  CALCIUM 8.8* 9.4 8.9   Liver Function Tests:  Recent Labs Lab 01/09/15 0036  AST 57*  ALT 69*  ALKPHOS 146*  BILITOT 1.6*  PROT 7.5  ALBUMIN 3.7    Recent Labs Lab 01/09/15 0036  LIPASE 58*   No results for input(s): AMMONIA in the last 168 hours. CBC:  Recent Labs Lab 01/08/15 1719 01/09/15 0120  WBC 7.7 8.8  NEUTROABS  --  4.4  HGB 17.1* 16.0  HCT 50.0 46.3  MCV 82.1 81.2  PLT 113* 111*   Cardiac Enzymes:  Recent Labs Lab 01/09/15 0036 01/09/15 0528  CKTOTAL 110  --   TROPONINI <0.03 <0.03   BNP (last 3 results)  Recent Labs  01/08/15 1719  BNP 17.1    ProBNP (last 3 results) No results for input(s): PROBNP in the last 8760 hours.  CBG:  Recent Labs Lab 01/09/15 0244 01/09/15 0346 01/09/15 0446 01/09/15 0542 01/09/15 0649  GLUCAP 189* 170* 171* 176* 159*    Recent Results (from the past 240 hour(s))  MRSA PCR Screening     Status: None  Collection Time: 01/08/15 11:45 PM  Result Value Ref Range Status   MRSA by PCR NEGATIVE NEGATIVE Final    Comment:        The GeneXpert MRSA Assay (FDA approved for NASAL specimens only), is one component of a comprehensive MRSA colonization surveillance program. It is not intended to diagnose MRSA infection nor to guide or monitor treatment for MRSA infections.      Studies: Dg Chest 2 View  01/08/2015   CLINICAL DATA:  Midline chest pain and shortness of breath for 1 1/2 weeks.  EXAM: CHEST  2 VIEW  COMPARISON:  None.  FINDINGS: The heart is normal in size. There is mild tortuosity of the thoracic aorta. The pulmonary hila appear normal. There are chronic appearing bronchitic type interstitial lung changes  and probable bibasilar scarring or subsegmental atelectasis. No infiltrates, edema or effusions. The bony thorax is intact.  IMPRESSION: Chronic appearing bronchitic type lung changes and bibasilar scarring or atelectasis. No infiltrates or effusions.   Electronically Signed   By: Marijo Sanes M.D.   On: 01/08/2015 18:09    Scheduled Meds: . aspirin  325 mg Oral Daily  . enoxaparin (LOVENOX) injection  40 mg Subcutaneous Daily  . metronidazole  500 mg Intravenous 3 times per day  . mometasone-formoterol  2 puff Inhalation BID  . pantoprazole  40 mg Oral Daily   Continuous Infusions: . sodium chloride 150 mL/hr at 01/08/15 2337  . dextrose 5 % and 0.45% NaCl 125 mL/hr at 01/09/15 0048  . insulin (NOVOLIN-R) infusion 3.8 Units/hr (01/09/15 6767)    Active Problems:   DKA (diabetic ketoacidoses)   Chest pain   ARF (acute renal failure)   COPD (chronic obstructive pulmonary disease)   Hypertension   DKA, type 2    Time spent: 35 minutes.     Niel Hummer A  [facility] Pager [phone]. If 7PM-7AM, please contact night-coverage at [email or web address], password [facility] 01/09/2015, 7:16 AM  LOS: 1 day

## 2015-01-09 NOTE — Progress Notes (Signed)
Inpatient Diabetes Program Recommendations  AACE/ADA: New Consensus Statement on Inpatient Glycemic Control (2013)  Target Ranges:  Prepandial:   less than 140 mg/dL      Peak postprandial:   less than 180 mg/dL (1-2 hours)      Critically ill patients:  140 - 180 mg/dL    Results for Scott Figueroa, Scott Figueroa (MRN [id number]) as of 01/09/2015 12:42  Ref. Range 01/08/2015 17:19  Sodium Latest Ref Range: 135-145 mmol/L 131 (L)  Potassium Latest Ref Range: 3.5-5.1 mmol/L 4.4  Chloride Latest Ref Range: 101-111 mmol/L 94 (L)  CO2 Latest Ref Range: 22-32 mmol/L 12 (L)  BUN Latest Ref Range: 6-20 mg/dL 27 (H)  Creatinine Latest Ref Range: 0.61-1.24 mg/dL 1.86 (H)  Calcium Latest Ref Range: 8.9-10.3 mg/dL 8.8 (L)  EGFR (Non-African Amer.) Latest Ref Range: >60 mL/min 37 (L)  EGFR (African American) Latest Ref Range: >60 mL/min 43 (L)  Glucose Latest Ref Range: 65-99 mg/dL 441 (H)  Anion gap Latest Ref Range: 5-15  25 (H)    Admit with: Weakness/ CP/ DKA/ New diagnosis of DM  History: HTN, COPD  Current DM Orders: IV insulin drip per DKA Protocol    **BMET for 11AM showed patient's CO2 still 19, Anion Gap 16.  **Patient remains on IV insulin drip.   When patient is ready to transition off IV insulin drip, please consider the following SQ insulin recommendations for transition:  1. Give Lantus 20 units at least 1 hour before IV insulin drip stopped (0.2 units/kg dosing)  2. Start Novolog Moderate SSI (0-15 units) TID AC + HS   Spoke with pt about new diagnosis.  Explained what an A1C is, basic pathophysiology of DM Type 2, basic home care, basic diabetes diet nutrition principles, importance of checking CBGs and maintaining good CBG control to prevent long-term and short-term complications.  Also reviewed blood sugar goals at home.    Also discussed DM diet information with patient.  Encouraged patient to avoid beverages with sugar (regular soda, sweet tea, lemonade, fruit juice) and to  consume mostly water.  Discussed what foods contain carbohydrates and how carbohydrates affect the body's blood sugar levels.  Encouraged patient to be careful with his portion sizes (especially grains, starchy vegetables, and fruits).  RNs to provide ongoing basic DM education at bedside with this patient.  Patient will need lots of reinforcement of basic DM educational survival skills.  Have ordered educational booklet and DM videos.  Have also placed RD consult for DM diet education for this patient.    Will follow Wyn Quaker RN, MSN, CDE Diabetes Coordinator Inpatient Glycemic Control Team Team Pager: [phone] (8a-5p)

## 2015-01-10 LAB — GLUCOSE, CAPILLARY
GLUCOSE-CAPILLARY: 91 mg/dL (ref 65–99)
GLUCOSE-CAPILLARY: 136 mg/dL — AB (ref 65–99)
GLUCOSE-CAPILLARY: 161 mg/dL — AB (ref 65–99)
GLUCOSE-CAPILLARY: 228 mg/dL — AB (ref 65–99)
Glucose-Capillary: 86 mg/dL (ref 65–99)
Glucose-Capillary: 84 mg/dL (ref 65–99)
Glucose-Capillary: 273 mg/dL — ABNORMAL HIGH (ref 65–99)

## 2015-01-10 LAB — BASIC METABOLIC PANEL
ANION GAP: 13 (ref 5–15)
BUN: 13 mg/dL (ref 6–20)
CO2: 20 mmol/L — AB (ref 22–32)
Calcium: 8.6 mg/dL — ABNORMAL LOW (ref 8.9–10.3)
Chloride: 101 mmol/L (ref 101–111)
Creatinine, Ser: 1.09 mg/dL (ref 0.61–1.24)
GFR calc Af Amer: >60 mL/min (ref >60)
GFR calc non Af Amer: >60 mL/min (ref >60)
GLUCOSE: 83 mg/dL (ref 65–99)
Potassium: 3.2 mmol/L — ABNORMAL LOW (ref 3.5–5.1)
SODIUM: 134 mmol/L — AB (ref 135–145)

## 2015-01-10 LAB — HEPATITIS PANEL, ACUTE
HCV Ab: <0.1 {s_co_ratio} (ref 0.0–0.9)
Hep A IgM: NEGATIVE
Hep B C IgM: NEGATIVE
Hepatitis B Surface Ag: NEGATIVE

## 2015-01-10 LAB — COMPREHENSIVE METABOLIC PANEL
ALBUMIN: 3 g/dL — AB (ref 3.5–5.0)
ALT: 101 U/L — AB (ref 17–63)
AST: 117 U/L — ABNORMAL HIGH (ref 15–41)
Alkaline Phosphatase: 110 U/L (ref 38–126)
Anion gap: 14 (ref 5–15)
BUN: 11 mg/dL (ref 6–20)
CALCIUM: 8.4 mg/dL — AB (ref 8.9–10.3)
CHLORIDE: 99 mmol/L — AB (ref 101–111)
CO2: 19 mmol/L — ABNORMAL LOW (ref 22–32)
Creatinine, Ser: 1.15 mg/dL (ref 0.61–1.24)
GFR calc Af Amer: >60 mL/min (ref >60)
Glucose, Bld: 155 mg/dL — ABNORMAL HIGH (ref 65–99)
Potassium: 5.1 mmol/L (ref 3.5–5.1)
Sodium: 132 mmol/L — ABNORMAL LOW (ref 135–145)
Total Bilirubin: 2.2 mg/dL — ABNORMAL HIGH (ref 0.3–1.2)
Total Protein: 6.3 g/dL — ABNORMAL LOW (ref 6.5–8.1)

## 2015-01-10 LAB — HIV ANTIBODY (ROUTINE TESTING W REFLEX): HIV SCREEN 4TH GENERATION: NONREACTIVE

## 2015-01-10 LAB — HEMOGLOBIN A1C
Hgb A1c MFr Bld: 12 % — ABNORMAL HIGH (ref 4.8–5.6)
Mean Plasma Glucose: 298 mg/dL

## 2015-01-10 MED ORDER — INSULIN ASPART 100 UNIT/ML ~~LOC~~ SOLN
0.0000 [IU] | Freq: Every day | SUBCUTANEOUS | Status: DC
  Administered 2015-01-10: 3 [IU] via SUBCUTANEOUS

## 2015-01-10 MED ORDER — AMLODIPINE BESYLATE 5 MG PO TABS
5.0000 mg | ORAL_TABLET | Freq: Every day | ORAL | Status: DC
  Administered 2015-01-10 – 2015-01-11 (×2): 5 mg via ORAL
  Filled 2015-01-10 (×2): qty 1

## 2015-01-10 MED ORDER — POTASSIUM CHLORIDE CRYS ER 20 MEQ PO TBCR
40.0000 meq | EXTENDED_RELEASE_TABLET | Freq: Once | ORAL | Status: AC
  Administered 2015-01-10: 40 meq via ORAL
  Filled 2015-01-10: qty 2

## 2015-01-10 MED ORDER — INSULIN GLARGINE 100 UNIT/ML ~~LOC~~ SOLN
20.0000 [IU] | Freq: Every day | SUBCUTANEOUS | Status: DC
  Administered 2015-01-10 – 2015-01-11 (×2): 20 [IU] via SUBCUTANEOUS
  Filled 2015-01-10 (×2): qty 0.2

## 2015-01-10 MED ORDER — INSULIN ASPART 100 UNIT/ML ~~LOC~~ SOLN
0.0000 [IU] | Freq: Three times a day (TID) | SUBCUTANEOUS | Status: DC
  Administered 2015-01-10: 2 [IU] via SUBCUTANEOUS
  Administered 2015-01-10: 5 [IU] via SUBCUTANEOUS
  Administered 2015-01-10: 3 [IU] via SUBCUTANEOUS
  Administered 2015-01-11: 11 [IU] via SUBCUTANEOUS
  Administered 2015-01-11: 5 [IU] via SUBCUTANEOUS

## 2015-01-10 MED ORDER — SODIUM CHLORIDE 0.9 % IV SOLN
INTRAVENOUS | Status: DC
  Filled 2015-01-10: qty 2.5

## 2015-01-10 MED ORDER — METRONIDAZOLE 500 MG PO TABS
500.0000 mg | ORAL_TABLET | Freq: Three times a day (TID) | ORAL | Status: DC
Start: 2015-01-10 — End: 2015-01-11
  Administered 2015-01-10 – 2015-01-11 (×5): 500 mg via ORAL
  Filled 2015-01-10 (×8): qty 1

## 2015-01-10 MED ORDER — INSULIN STARTER KIT- PEN NEEDLES (ENGLISH)
1.0000 | Freq: Once | Status: AC
  Administered 2015-01-10: 1
  Filled 2015-01-10: qty 1

## 2015-01-10 NOTE — Progress Notes (Signed)
Initial Nutrition Assessment  DOCUMENTATION CODES:  Obesity unspecified  INTERVENTION:  Provide nourishment snacks. (Ordered)  Encourage adequate PO intake.   Diabetic diet education given.  NUTRITION DIAGNOSIS:  Inadequate oral intake related to  (decreased appetite) as evidenced by per patient/family report.  GOAL:  Patient will meet greater than or equal to 90% of their needs  MONITOR:  PO intake, Weight trends, Labs, I & O's  REASON FOR ASSESSMENT:  Consult Diet education  ASSESSMENT: Pt with history of COPD, ongoing tobacco abuse, hypertension and gout presents to the ER because of weakness and chest pain. In the ER patient's initial metabolic panel shows elevated anion gap with blood sugar consistent with DKA.  Pt reports having a poor po intake 1 week prior to admission. He reports he had only been drinking water and juice. Pt does report weight loss with usual body weight of ~250 lbs which he reports weighing 4 months ago. Noted pt with a 12.4% weight loss in 4 months. No weight history recorded in Epic to confirm. Current meal completion is 75%. Pt reports his appetite is improving. Pt refused supplements at this time. RD to order nourishment snacks to aid in adequate intake. RD was additionally consulted for a diet education regarding diabetes. Education given.  NFPE completed. Pt with no observed significant fat or muscle mass loss.   Labs: Low sodium, chloride, CO2, and calcium. High AST, ALT, and total bilirubin.   Height:  Ht Readings from Last 1 Encounters:  01/08/15 5\' 11"  (1.803 m)    Weight:  Wt Readings from Last 1 Encounters:  01/08/15 219 lb 2.2 oz (99.4 kg)    Ideal Body Weight:  78 kg  Wt Readings from Last 10 Encounters:  01/08/15 219 lb 2.2 oz (99.4 kg)    BMI:  Body mass index is 30.58 kg/(m^2). Class I obesity  Estimated Nutritional Needs:  Kcal:  2000-2200  Protein:  105-120 grams  Fluid:  2 -2.3 L/day  Skin:  Reviewed,  no issues  Diet Order:  Diet Carb Modified Fluid consistency:: Thin; Room service appropriate?: Yes  EDUCATION NEEDS:  Education needs addressed   Intake/Output Summary (Last 24 hours) at 01/10/15 1327 Last data filed at 01/10/15 0900  Gross per 24 hour  Intake    780 ml  Output    850 ml  Net    -70 ml    Last BM:  6/28  Roslyn SmilingStephanie Nasim Habeeb, MS, RD, LDN Pager # [phone] After hours/ weekend pager # [phone]

## 2015-01-10 NOTE — Clinical Documentation Improvement (Signed)
Abnormal Lab and/or Testing Results: Sodium: 6/29:  130. 6/28:  131.   Treatment provided: 6/28:  Sodium chloride @ 150 cc/hr.   Possible Clinical Conditions: >  Hyponatremia >  Other >  Not able to determine    Thank you, Debria GarretWanda Mathews-Bethea,RN,BSN,Clinical Documentation Specialist [phone] [email or web address]

## 2015-01-10 NOTE — Plan of Care (Signed)
Problem: Food- and Nutrition-Related Knowledge Deficit (NB-1.1) Goal: Nutrition education Formal process to instruct or train a patient/client in a skill or to impart knowledge to help patients/clients voluntarily manage or modify food choices and eating behavior to maintain or improve health. Outcome: Completed/Met Date Met:  01/10/15  RD consulted for nutrition education regarding diabetes.     Lab Results  Component Value Date    HGBA1C 12.0* 01/09/2015    RD provided "Carbohydrate Counting for People with Diabetes" handout from the Academy of Nutrition and Dietetics. Discussed different food groups and their effects on blood sugar, emphasizing carbohydrate-containing foods. Provided list of carbohydrates and recommended serving sizes of common foods.  Discussed importance of controlled and consistent carbohydrate intake throughout the day. Recommended 4-5 servings of carbohydrate at each meal. Emphasized adequate protein intake. Provided examples of ways to balance meals/snacks and encouraged intake of high-fiber, whole grain complex carbohydrates. Discussed diabetic friendly drink options. Teach back method used.  Expect good compliance.  Scott Parker, MS, RD, LDN Pager # [phone] After hours/ weekend pager # [phone]

## 2015-01-10 NOTE — Progress Notes (Signed)
Inpatient Diabetes Program Recommendations  AACE/ADA: New Consensus Statement on Inpatient Glycemic Control (2013)  Target Ranges:  Prepandial:   less than 140 mg/dL      Peak postprandial:   less than 180 mg/dL (1-2 hours)      Critically ill patients:  140 - 180 mg/dL   6/30 Ordered insulin pen starter kit and assistance with watching the ed'l videos per system network. Brought patient a teaching insulin pen to demonstrate how to attach needle, air shot, dosage dial and injection technique using teach-back method. Reviewed areas of the body for injecting. Neice of patient also in room who has been involved in diabetes care in nursing school. Pt Both patient and niece are familiar with using an insulin pen as patient's mother/ niece's grandmother used an insulin pen and both would assist with the process and injection. Left teaching pen and a needle in the room with a dome for RN to reinforce the technique.Ordered a pen starter kit which will have pen needles in the kit. Requested RN/CNA  assistance with watching the videos. Also requested that patient practice checking cbg's at home using the floor meter here. Patient states he knows how to do this as well, but needs to be observed for proper technique. Patient appears to have solid working knowledge of diabetes regarding these basic skills, acute complications of hypo-and hyperglycemia. Glad to revisit tomorrow.  Thank you Rosita Kea, RN, MSN, CDE  Diabetes Inpatient Program Office: [phone] Pager: [phone] 8:00 am to 5:00 pm

## 2015-01-10 NOTE — Progress Notes (Signed)
Patient to transfer to 5W30 report given to receiving nurse Katie all questions answered at this time.  Pt. VSS with no s/s of distress noted.  Patient stable at transfer.

## 2015-01-10 NOTE — Progress Notes (Signed)
Pt having destat episodes due to sleep apnea. Pt recovers quickly upon awakening. Nonsustained v-tach due to sternal rub to wake pt for breath. Nurse applied 2L O2 via  for comfort. Pt resting comfortably. Will continue monitor.

## 2015-01-10 NOTE — Progress Notes (Signed)
NURSING PROGRESS NOTE  Scott Figueroa [id number] Transfer Data: 01/10/2015 10:03 AM Attending Provider: Alba CoryBelkys A Regalado, MD PCP:No primary care provider on file. Code Status: FUll  Scott Figueroa is a 64 y.o. male [facility] [address]  -No acute distress noted.  -No complaints of shortness of breath.  -No complaints of chest pain.   Cardiac Monitoring: Box # 2 in place.   Blood pressure 167/71, pulse 70, temperature 98 F (36.7 C), temperature source Oral, resp. rate 14, height 5\' 11"  (1.803 m), weight 99.4 kg (219 lb 2.2 oz), SpO2 97 %.   IV Fluids:  IV in place, running NS at 6475ml/hour. Allergies:  Review of patient's allergies indicates no known allergies.  Past Medical History:   has a past medical history of Acid reflux; Hypertension; COPD (chronic obstructive pulmonary disease); and Gout.  Past Surgical History:   has past surgical history that includes No past surgeries.  Social History:   reports that he has been smoking Cigarettes.  He does not have any smokeless tobacco history on file. He reports that he drinks alcohol. He reports that he does not use illicit drugs.  Skin: Intact  Patient/Family orientated to room. Information packet given to patient/family. Admission inpatient armband information verified with patient/family to include name and date of birth and placed on patient arm. Side rails up x 2, fall assessment and education completed with patient/family. Patient/family able to verbalize understanding of risk associated with falls and verbalized understanding to call for assistance before getting out of bed. Call light within reach. Patient/family able to voice and demonstrate understanding of unit orientation instructions.    Will continue to evaluate and treat per MD orders.

## 2015-01-10 NOTE — Care Management Note (Signed)
Case Management Note  Patient Details  Name: Scott Figueroa MRN: [id number] Date of Birth: 08/10/1950  Subjective/Objective:   Patient is from home, NCM will cont to follow for dc needs.                  Action/Plan:   Expected Discharge Date:                  Expected Discharge Plan:  Home/Self Care  In-House Referral:     Discharge planning Services  CM Consult  Post Acute Care Choice:    Choice offered to:     DME Arranged:    DME Agency:     HH Arranged:    HH Agency:     Status of Service:  In process, will continue to follow  Medicare Important Message Given:    Date Medicare IM Given:    Medicare IM give by:    Date Additional Medicare IM Given:    Additional Medicare Important Message give by:     If discussed at Long Length of Stay Meetings, dates discussed:    Additional Comments:  Leone Havenaylor, Elmar [place], RN 01/10/2015, 4:39 PM

## 2015-01-10 NOTE — Progress Notes (Addendum)
[facility] PROGRESS NOTE  Scott Figueroa NFA:[id number] DOB: [birth year] DOA: 01/08/2015 PCP: No primary care provider on file.  Assessment/Plan: 1-DKA, New diagnosis of diabetes.  Gap close. Was transition to lantus overnight.  Diabetes educator consulted.  Follow CBG , adjust insulin as needed.   2-Chest pain with exertional symptoms concerning for angina - troponin times one negative.   2-D echo normal Ef.  . Aspirin. Cardiology consulted. No further cardiac work up as ECHO was normal.   Abdominal pain -resolved, suspect was elated to DKA mild elevated LFT. [place] negative for cholecystitis.  Continue with Protonix.  hepatitis panel negative.    Hypokalemia; replete.   Hypoxemia; overnight; need sleep study. Might need home oxygen.  Renal failure probably acute on chronic - baseline creatinine not known. Continue to hold off ARB's and HCTZ.  Improved with IV fluids. Cr peak to 1.8. It has decrease to 1.0.   Hypertension -  Hold ARB and HCTZ in setting or renal failure. PRN hydralazine.  Will start Norvasc.   COPD - continue home inhalers. Albuterol PRN.   History of gout. colchicine PRN.   Tobacco abuse - patient advised to quit smoking.  Trichomoniasis in the urine - Continue with Flagyl. HIV negative    Code Status: Full Code.  Family Communication: Care discussed with patient.  Disposition Plan: Transfer to telemetry. Follow CBG. Might be able to be discharge 7-1   Consultants:  Cardiology  Procedures:  Abdominal US; negative for cholecystitis  ECHO. Normal Ef  Antibiotics:  Others flagyl.  HPI/Subjective: Feeling better. Denies chest pain. Abdominal pain has resolved.   Objective: Filed Vitals:   01/10/15 0728  BP: 147/70  Pulse: 65  Temp: 98 F (36.7 C)  Resp: 13    Intake/Output Summary (Last 24 hours) at 01/10/15 0807 Last data filed at 01/10/15 0600  Gross per 24 hour  Intake    900 ml  Output   1350 ml  Net   -450 ml   Filed  Weights   01/08/15 2111 01/08/15 2300  Weight: 103.42 kg (228 lb) 99.4 kg (219 lb 2.2 oz)    Exam:   General:  Alert in no distress.   Cardiovascular: S 1, S 2 RRR  Respiratory: CTA  Abdomen: BS present, soft, nt  Musculoskeletal: trace edema.   Data Reviewed: Basic Metabolic Panel:  Recent Labs Lab 01/09/15 0800 01/09/15 1055 01/09/15 1518 01/09/15 1850 01/09/15 2110 01/10/15 0106  NA 135 135  --  130* 131* 134*  K 3.5 3.4*  --  3.8 3.6 3.2*  CL 99* 100*  --  96* 97* 101  CO2 19* 19*  --  20* 20* 20*  GLUCOSE 173* 120*  --  239* 190* 83  BUN 22* 18  --  CREATININE 1.35* 1.24  --  1.32* 1.22 1.09  CALCIUM 8.9 8.9  --  8.6* 8.7* 8.6*  MG  --   --  1.9  --   --   --    Liver Function Tests:  Recent Labs Lab 01/09/15 0036  AST 57*  ALT 69*  ALKPHOS 146*  BILITOT 1.6*  PROT 7.5  ALBUMIN 3.7    Recent Labs Lab 01/09/15 0036  LIPASE 58*   No results for input(s): AMMONIA in the last 168 hours. CBC:  Recent Labs Lab 01/08/15 1719 01/09/15 0120  WBC 7.7 8.8  NEUTROABS  --  4.4  HGB 17.1* 16.0  HCT 50.0 46.3  MCV 82.1 81.2  PLT 113* 111*   Cardiac Enzymes:  Recent Labs Lab 01/09/15 0036 01/09/15 0528 01/09/15 1055  CKTOTAL 110  --   --   TROPONINI <0.03 <0.03 0.03   BNP (last 3 results)  Recent Labs  01/08/15 1719  BNP 17.1    ProBNP (last 3 results) No results for input(s): PROBNP in the last 8760 hours.  CBG:  Recent Labs Lab 01/09/15 2106 01/09/15 2208 01/10/15 0119 01/10/15 0225 01/10/15 0330  GLUCAP 218* 170* 86 84 91    Recent Results (from the past 240 hour(s))  MRSA PCR Screening     Status: None   Collection Time: 01/08/15 11:45 PM  Result Value Ref Range Status   MRSA by PCR NEGATIVE NEGATIVE Final    Comment:        The GeneXpert MRSA Assay (FDA approved for NASAL specimens only), is one component of a comprehensive MRSA colonization surveillance program. It is not intended to diagnose  MRSA infection nor to guide or monitor treatment for MRSA infections.      Studies: Dg Chest 2 View  01/08/2015   CLINICAL DATA:  Midline chest pain and shortness of breath for 1 1/2 weeks.  EXAM: CHEST  2 VIEW  COMPARISON:  None.  FINDINGS: The heart is normal in size. There is mild tortuosity of the thoracic aorta. The pulmonary hila appear normal. There are chronic appearing bronchitic type interstitial lung changes and probable bibasilar scarring or subsegmental atelectasis. No infiltrates, edema or effusions. The bony thorax is intact.  IMPRESSION: Chronic appearing bronchitic type lung changes and bibasilar scarring or atelectasis. No infiltrates or effusions.   Electronically Signed   By: Rudie Meyer M.D.   On: 01/08/2015 18:09   US Abdomen Complete  01/09/2015   CLINICAL DATA:  Epigastric abdominal pain for the past 10 days  EXAM: ULTRASOUND ABDOMEN COMPLETE  COMPARISON:  None.  FINDINGS: Gallbladder: No gallstones or wall thickening visualized. No sonographic Murphy sign noted.  Common bile duct: Diameter: 3.5 mm  Liver: The hepatic echotexture is mildly increased. Adjacent to the gallbladder there is a 2.6 x 2.2 x 1.9 cm focus of decreased echogenicity. This may reflect focal fatty sparing. There is no intrahepatic ductal dilation.  IVC: No abnormality visualized.  Pancreas: Visualized portion unremarkable.  Spleen: Size and appearance within normal limits.  Right Kidney: Length: 13.4 cm. Echogenicity within normal limits. No mass or hydronephrosis visualized.  Left Kidney: Length: 12.4 cm. Echogenicity within normal limits. No mass or hydronephrosis visualized.  Abdominal aorta: No aneurysm visualized.  Other findings: No free fluid  IMPRESSION: 1. Fatty infiltrative change of the liver. Probable focal fatty sparing adjacent to the gallbladder fossa. 2. There is no evidence of acute cholecystitis. If there remain clinical concerns of gallbladder dysfunction, a nuclear medicine hepatobiliary  scan would be useful. 3. No acute abnormality is observed elsewhere within the abdomen.   Electronically Signed   By: David  [place] M.D.   On: 01/09/2015 10:25    Scheduled Meds: . aspirin  325 mg Oral Daily  . enoxaparin (LOVENOX) injection  40 mg Subcutaneous Daily  . insulin aspart  0-15 Units Subcutaneous TID WC  . insulin aspart  0-5 Units Subcutaneous QHS  . insulin glargine  20 Units Subcutaneous Daily  . metroNIDAZOLE  500 mg Oral 3 times per day  . mometasone-formoterol  2 puff Inhalation BID  . pantoprazole  40 mg Oral Daily   Continuous Infusions: . sodium chloride 75 mL/hr at 01/10/15 0353  .  insulin (NOVOLIN-R) infusion Stopped (01/10/15 0400)    Active Problems:   DKA (diabetic ketoacidoses)   Chest pain with minimal risk for cardiac etiology   ARF (acute renal failure)   COPD (chronic obstructive pulmonary disease)   Hypertension   DKA, type 2    Time spent: 35 minutes.     Hartley Barefootegalado, Natlie Asfour A  [facility] Pager [phone]. If 7PM-7AM, please contact night-coverage at [email or web address], password [email or web address] 01/10/2015, 8:07 AM  LOS: 2 days

## 2015-01-10 NOTE — Progress Notes (Signed)
Discussed patient with Dr. [place]. Brief NSVT yesterday (7 beats) in setting of hypokalemia. (Prior cardiology note says 7 runs but meant to say 7 beats.) Echo shows normal EF. Per original plan no further cardiac workup planned. Would recommend to keep K>4.0. Please call if needed. Eulamae Greenstein PA-C

## 2015-01-11 DIAGNOSIS — J438 Other emphysema: Secondary | ICD-10-CM

## 2015-01-11 DIAGNOSIS — E1110 Type 2 diabetes mellitus with ketoacidosis without coma: Secondary | ICD-10-CM | POA: Insufficient documentation

## 2015-01-11 DIAGNOSIS — N179 Acute kidney failure, unspecified: Secondary | ICD-10-CM

## 2015-01-11 DIAGNOSIS — K219 Gastro-esophageal reflux disease without esophagitis: Secondary | ICD-10-CM | POA: Insufficient documentation

## 2015-01-11 LAB — BASIC METABOLIC PANEL
ANION GAP: 13 (ref 5–15)
BUN: 9 mg/dL (ref 6–20)
CO2: 23 mmol/L (ref 22–32)
Calcium: 8.5 mg/dL — ABNORMAL LOW (ref 8.9–10.3)
Chloride: 98 mmol/L — ABNORMAL LOW (ref 101–111)
Creatinine, Ser: 1 mg/dL (ref 0.61–1.24)
GFR calc Af Amer: >60 mL/min (ref >60)
GFR calc non Af Amer: >60 mL/min (ref >60)
GLUCOSE: 223 mg/dL — AB (ref 65–99)
Potassium: 3.5 mmol/L (ref 3.5–5.1)
Sodium: 134 mmol/L — ABNORMAL LOW (ref 135–145)

## 2015-01-11 LAB — GLUCOSE, CAPILLARY
Glucose-Capillary: 221 mg/dL — ABNORMAL HIGH (ref 65–99)
Glucose-Capillary: 338 mg/dL — ABNORMAL HIGH (ref 65–99)
Glucose-Capillary: 219 mg/dL — ABNORMAL HIGH (ref 65–99)

## 2015-01-11 LAB — URINE CULTURE: Culture: 20000

## 2015-01-11 MED ORDER — ASPIRIN 81 MG PO TABS: 81.0000 mg | ORAL_TABLET | Freq: Every day | ORAL | Status: AC

## 2015-01-11 MED ORDER — OMEPRAZOLE 40 MG PO CPDR: 40.0000 mg | DELAYED_RELEASE_CAPSULE | Freq: Every day | ORAL | Status: DC

## 2015-01-11 MED ORDER — INSULIN ASPART 100 UNIT/ML FLEXPEN: 8.0000 [IU] | PEN_INJECTOR | Freq: Three times a day (TID) | SUBCUTANEOUS | Status: DC

## 2015-01-11 MED ORDER — "PEN NEEDLES 3/16"" 31G X 5 MM MISC": Status: DC

## 2015-01-11 MED ORDER — AMLODIPINE BESYLATE 5 MG PO TABS: 5.0000 mg | ORAL_TABLET | Freq: Every day | ORAL | Status: AC

## 2015-01-11 MED ORDER — BLOOD GLUCOSE METER KIT: PACK | Status: DC

## 2015-01-11 MED ORDER — METRONIDAZOLE 500 MG PO TABS: 500.0000 mg | ORAL_TABLET | Freq: Three times a day (TID) | ORAL | Status: AC

## 2015-01-11 MED ORDER — INSULIN GLARGINE 100 UNIT/ML SOLOSTAR PEN: 25.0000 [IU] | PEN_INJECTOR | Freq: Every day | SUBCUTANEOUS | Status: DC

## 2015-01-11 NOTE — Progress Notes (Signed)
Patient discharge teaching given, including activity, diet, follow-up appoints, diabetes, insulin administration  and other medications. Patient verbalized understanding of all discharge instructions. IV access was d/c'd. Vitals are stable. Skin is intact except as charted in most recent assessments. Pt to be escorted out by RN, to be driven home by family.

## 2015-01-11 NOTE — Discharge Instructions (Signed)
Diabetic Ketoacidosis °Diabetic ketoacidosis (DKA) is a life-threatening complication of type 1 diabetes. It must be quickly recognized and treated. Treatment requires hospitalization. °CAUSES  °When there is no insulin in the body, glucose (sugar) cannot be used, and the body breaks down fat for energy. When fat breaks down, acids (ketones) build up in the blood. Very high levels of glucose and high levels of acids lead to severe loss of body fluids (dehydration) and other dangerous chemical changes. This stresses your vital organs and can cause coma or death. °SIGNS AND SYMPTOMS  °· Tiredness (fatigue). °· Weight loss. °· Excessive thirst. °· Ketones in your urine. °· Light-headedness. °· Fruity or sweet smelling breath. °· Excessive urination. °· Visual changes. °· Confusion or irritability. °· Nausea or vomiting. °· Rapid breathing. °· Stomachache or abdominal pain. °DIAGNOSIS  °Your health care provider will diagnose DKA based on your history, physical exam, and blood tests. The health care provider will check to see if you have another illness that caused you to go into DKA. Most of this will be done quickly in an emergency room. °TREATMENT  °· Fluid replacement to correct dehydration. °· Insulin. °· Correction of electrolytes, such as potassium and sodium. °· Antibiotic medicines. °PREVENTION °· Always take your insulin. Do not skip your insulin injections. °· If you are sick, treat yourself quickly. Your body often needs more insulin to fight the illness. °· Check your blood glucose regularly. °· Check urine ketones if your blood glucose is greater than 240 milligrams per deciliter (mg/dL). °· Do not use outdated (expired) insulin. °· If your blood glucose is high, drink plenty of fluids. This helps flush out ketones. °HOME CARE INSTRUCTIONS  °· If you are sick, follow the advice of your health care provider. °· To prevent dehydration, drink enough water and fluids to keep your urine clear or pale  yellow. °¨ If you cannot eat, alternate between drinking fluids with sugar (soda, juices, flavored gelatin) and salty fluids (broth, bouillon). °¨ If you can eat, follow your usual diet and drink sugar-free liquids (water, diet drinks). °· Always take your usual dose of insulin. If you cannot eat or if your glucose is getting too low, call your health care provider for further instructions. °· Continue to monitor your blood or urine ketones every 3-4 hours around the clock. Set your alarm clock or have someone wake you up. If you are too sick, have someone test it for you. °· Rest and avoid exercise. °SEEK MEDICAL CARE IF:  °· You have a fever. °· You have ketones in your urine, or your blood glucose is higher than a level your health care provider suggests. You may need extra insulin. Call your health care provider if you need advice on adjusting your insulin. °· You cannot drink at least a tablespoon (15 mL) of fluid every 15-20 minutes. °· You have been vomiting for more than 2 hours. °· You have symptoms of DKA: °¨ Fruity smelling breath. °¨ Breathing faster or slower. °¨ Becoming very sleepy. °SEEK IMMEDIATE MEDICAL CARE IF:  °· You have signs of dehydration: °¨ Decreased urination. °¨ Increased thirst. °¨ Dry skin and mouth. °¨ Light-headedness. °· Your blood glucose is very high (as advised by your health care provider) twice in a row. °· You faint. °· You have chest pain or trouble breathing. °· You have a sudden, severe headache. °· You have sudden weakness in one arm or one leg. °· You have sudden trouble speaking or swallowing. °· You   have vomiting or diarrhea that is getting worse after 3 hours. °· You have abdominal pain. °MAKE SURE YOU:  °· Understand these instructions. °· Will watch your condition. °· Will get help right away if you are not doing well or get worse. °Document Released: 06/26/2000 Document Revised: 07/04/2013 Document Reviewed: 01/02/2009 °ExitCare® Patient Information ©2015 [employer]  [employer]. This information is not intended to replace advice given to you by your health care provider. Make sure you discuss any questions you have with your health care provider. ° °

## 2015-01-11 NOTE — Discharge Summary (Signed)
Physician Discharge Summary  Scott Figueroa MRN:[id number] DOB: 07/27/1950 DOA: 01/08/2015  PCP: No primary care provider on file.  Admit date: 01/08/2015 Discharge date: 01/11/2015  Time spent: >30 minutes  Recommendations for Outpatient Follow-up:  1. Repeat basic metabolic panel to follow electrolytes and renal function 2. Reassess blood pressure and adjust antihypertensive regimen as needed 3. Close follow-up to patient's CBGs/A1c and provide further adjustment to hypoglycemic regimen as needed  Discharge Diagnoses:  Active Problems:   DKA (diabetic ketoacidoses)   Chest pain with minimal risk for cardiac etiology   ARF (acute renal failure)   COPD (chronic obstructive pulmonary disease)   Hypertension   DKA, type 2   Discharge Condition: Stable and improved. Patient has been discharged home with home health [facility] in order to us disease with medication transition and new use of insulin  Diet recommendation: Heart healthy diet and low carbohydrates  Filed Weights   01/08/15 2111 01/08/15 2300  Weight: 103.42 kg (228 lb) 99.4 kg (219 lb 2.2 oz)    History of present illness:  64 y.o. male with history of COPD, ongoing tobacco abuse, hypertension and gout presents to the ER because of weakness and chest pain. Patient states over the last 1 week patient has been feeling weak and has been having some exertional chest pain. Chest pain is retrosternal and pressure-like and increases on walking. Patient states he has chronic shortness of breath. Patient states he also has been having some epigastric discomfort with nausea and poor oral intake over the last 1 week. In the ER patient's initial metabolic panel shows elevated anion gap with high blood sugar consistent with DKA. Patient has been admitted for further management of DKA and chest pain  Hospital Course:  1-DKA, New diagnosis of diabetes.  -Treated appropriately with IV insulin therapy and fluid resuscitation  -Electrolytes were  repleted  -Patient has been now discharged on Lantus and sliding scale insulin  -Will need close follow-up and further adjustment to current hypoglycemic regimen based on CBGs fluctuation -Hemoglobin A1c 12.0 -Patient needs a visit with an ophthalmologist  2-Chest pain with exertional symptoms concerning for angina  - troponin times 3 negative.  -2-D echo normal EF and no wall motion abnormalities -Started on aspirin. Cardiology consulted. No further cardiac work up as ECHO was normal.   3-Abdominal pain -resolved, suspect was elated to DKA and GERD -mild elevated LFT.  -US negative for cholecystitis.  -Continue with Protonix.  -hepatitis panel negative.   4Hypokalemia; repleted.   5-Renal failure probably acute on chronic - baseline creatinine not known. -Cr peak to 1.8. It has decrease to 1.0.  -improved with IV fluids and at discharge essentially WNL.  -will resume antihypertensive agents and repeat BMET during follow up visit  6-Hypertension - . -will resume antihypertensive agents -advise to follow low sodium diet  7-COPD - continue home inhalers and Albuterol PRN.  -no wheezing -Patient advise to stop smoking  8-History of gout. Continue colchicine PRN.   9-Tobacco abuse - patient advised to quit smoking.  10-Trichomoniasis in the urine - Continue with Flagyl. HIV negative  Procedures:  Abdominal US: negative for cholecystitis  2-D ECHO: Normal [place] motion abnormalitiesF,   Consultations:  Cardiology  Discharge Exam: Filed Vitals:   01/11/15 1408  BP: 149/72  Pulse: 81  Temp: 98.1 F (36.7 C)  Resp: 20    General: Afebrile, no chest pain, no shortness of breath. Patient denies nausea and vomiting Cardiovascular: S1 and S2, no rubs, no gallops;   no JVD Respiratory: Clear to auscultation bilaterally Abdomen: Soft, nontender, positive bowel sounds, no guarding Extremities: No edema or cyanosis appreciated on exam   Discharge  Instructions   Discharge Instructions    Ambulatory referral to Nutrition and Diabetic Education    Complete by:  As directed   New diagnosis of DM.  A1c pending.  Pt lives in [place].  PCP Dr. Campbell.  Not sure of d/c DM medications yet.  Thanks!     Diet - low sodium heart healthy    Complete by:  As directed      Discharge instructions    Complete by:  As directed   Take medications as prescribed Follow a low carbohydrates diet Arrange follow-up with her PCP in 10 days Keep herself well hydrated Follow a low-sodium/heart healthy diet.          Current Discharge Medication List    START taking these medications   Details  amLODipine (NORVASC) 5 MG tablet Take 1 tablet (5 mg total) by mouth daily. Qty: 30 tablet, Refills: 1    aspirin 81 MG tablet Take 1 tablet (81 mg total) by mouth daily. Qty: 30 tablet, Refills: 5    blood glucose meter kit and supplies Dispense based on patient and insurance preference. Use up to four times daily as directed. (FOR ICD-9 250.00, 250.01). Qty: 1 each, Refills: 0    insulin aspart (NOVOLOG FLEXPEN) 100 UNIT/ML FlexPen Inject 8 Units into the skin 3 (three) times daily with meals. Qty: 15 mL, Refills: 11    Insulin Glargine (LANTUS SOLOSTAR) 100 UNIT/ML Solostar Pen Inject 25 Units into the skin daily at 10 pm. Qty: 15 mL, Refills: 11    Insulin Pen Needle (PEN NEEDLES 3/16") 31G X 5 MM MISC Use one needle every time you are going to inject insulin therapy. Qty: 200 each, Refills: 1    metroNIDAZOLE (FLAGYL) 500 MG tablet Take 1 tablet (500 mg total) by mouth every 8 (eight) hours. Qty: 18 tablet, Refills: 0      CONTINUE these medications which have CHANGED   Details  omeprazole (PRILOSEC) 40 MG capsule Take 1 capsule (40 mg total) by mouth daily. Qty: 30 capsule, Refills: 1      CONTINUE these medications which have NOT CHANGED   Details  benazepril-hydrochlorthiazide (LOTENSIN HCT) 20-25 MG per tablet Take 1 tablet by mouth  daily.    colchicine 0.6 MG tablet Take 0.6 mg by mouth daily as needed (gout).    Fluticasone-Salmeterol (ADVAIR) 250-50 MCG/DOSE AEPB Inhale 1 puff into the lungs 2 (two) times daily.       No Known Allergies Follow-up Information    Follow up with [facility].   Why:  HHRN, to check on make sure patient is taking insulin injections correctly   Contact information:   [address] [place] [address] [phone]       Follow up with CAMPBELL, STEPHEN D., MD. Schedule an appointment as soon as possible for a visit in 10 days.   Specialty:  Internal Medicine   Contact information:   [address] [place] [address] [phone]       The results of significant diagnostics from this hospitalization (including imaging, microbiology, ancillary and laboratory) are listed below for reference.    Significant Diagnostic Studies: Dg Chest 2 View  01/08/2015   CLINICAL DATA:  Midline chest pain and shortness of breath for 1 1/2 weeks.  EXAM: CHEST  2   VIEW  COMPARISON:  None.  FINDINGS: The heart is normal in size. There is mild tortuosity of the thoracic aorta. The pulmonary hila appear normal. There are chronic appearing bronchitic type interstitial lung changes and probable bibasilar scarring or subsegmental atelectasis. No infiltrates, edema or effusions. The bony thorax is intact.  IMPRESSION: Chronic appearing bronchitic type lung changes and bibasilar scarring or atelectasis. No infiltrates or effusions.   Electronically Signed   By: P.  Gallerani M.D.   On: 01/08/2015 18:09   Us Abdomen Complete  01/09/2015   CLINICAL DATA:  Epigastric abdominal pain for the past 10 days  EXAM: ULTRASOUND ABDOMEN COMPLETE  COMPARISON:  None.  FINDINGS: Gallbladder: No gallstones or wall thickening visualized. No sonographic Murphy sign noted.  Common bile duct: Diameter: 3.5 mm  Liver: The hepatic echotexture is mildly increased. Adjacent to the  gallbladder there is a 2.6 x 2.2 x 1.9 cm focus of decreased echogenicity. This may reflect focal fatty sparing. There is no intrahepatic ductal dilation.  IVC: No abnormality visualized.  Pancreas: Visualized portion unremarkable.  Spleen: Size and appearance within normal limits.  Right Kidney: Length: 13.4 cm. Echogenicity within normal limits. No mass or hydronephrosis visualized.  Left Kidney: Length: 12.4 cm. Echogenicity within normal limits. No mass or hydronephrosis visualized.  Abdominal aorta: No aneurysm visualized.  Other findings: No free fluid  IMPRESSION: 1. Fatty infiltrative change of the liver. Probable focal fatty sparing adjacent to the gallbladder fossa. 2. There is no evidence of acute cholecystitis. If there remain clinical concerns of gallbladder dysfunction, a nuclear medicine hepatobiliary scan would be useful. 3. No acute abnormality is observed elsewhere within the abdomen.   Electronically Signed   By: David  Jordan M.D.   On: 01/09/2015 10:25    Microbiology: Recent Results (from the past 240 hour(s))  Culture, Urine     Status: None   Collection Time: 01/08/15  9:02 PM  Result Value Ref Range Status   Specimen Description URINE, CLEAN CATCH  Final   Special Requests ADDED 0532 01/09/15  Final   Culture 20,000 COLONIES/mL CITROBACTER KOSERI  Final   Report Status 01/11/2015 FINAL  Final   Organism ID, Bacteria CITROBACTER KOSERI  Final      Susceptibility   Citrobacter koseri - MIC*    CEFAZOLIN <=4 SENSITIVE Sensitive     CEFTRIAXONE <=1 SENSITIVE Sensitive     CIPROFLOXACIN <=0.25 SENSITIVE Sensitive     GENTAMICIN <=1 SENSITIVE Sensitive     IMIPENEM <=0.25 SENSITIVE Sensitive     NITROFURANTOIN 32 SENSITIVE Sensitive     TRIMETH/SULFA <=20 SENSITIVE Sensitive     PIP/TAZO <=4 SENSITIVE Sensitive     * 20,000 COLONIES/mL CITROBACTER KOSERI  MRSA PCR Screening     Status: None   Collection Time: 01/08/15 11:45 PM  Result Value Ref Range Status   MRSA by PCR  NEGATIVE NEGATIVE Final    Comment:        The GeneXpert MRSA Assay (FDA approved for NASAL specimens only), is one component of a comprehensive MRSA colonization surveillance program. It is not intended to diagnose MRSA infection nor to guide or monitor treatment for MRSA infections.      Labs: Basic Metabolic Panel:  Recent Labs Lab 01/09/15 1518 01/09/15 1850 01/09/15 2110 01/10/15 0106 01/10/15 0925 01/11/15 0415  NA  --  130* 131* 134* 132* 134*  K  --  3.8 3.6 3.2* 5.1 3.5  CL  --  96* 97* 101 99* 98*  CO2  --    20* 20* 20* 19* 23  GLUCOSE  --  239* 190* 83 155* 223*  BUN  --  _0 CREATININE  --  1.32* 1.22 1.09 1.15 1.00  CALCIUM  --  8.6* 8.7* 8.6* 8.4* 8.5*  MG 1.9  --   --   --   --   --    Liver Function Tests:  Recent Labs Lab 01/09/15 0036 01/10/15 0925  AST 57* 117*  ALT 69* 101*  ALKPHOS 146* 110  BILITOT 1.6* 2.2*  PROT 7.5 6.3*  ALBUMIN 3.7 3.0*    Recent Labs Lab 01/09/15 0036  LIPASE 58*   CBC:  Recent Labs Lab 01/08/15 1719 01/09/15 0120  WBC 7.7 8.8  NEUTROABS  --  4.4  HGB 17.1* 16.0  HCT 50.0 46.3  MCV 82.1 81.2  PLT 113* 111*   Cardiac Enzymes:  Recent Labs Lab 01/09/15 0036 01/09/15 0528 01/09/15 1055  CKTOTAL 110  --   --   TROPONINI <0.03 <0.03 0.03   BNP: BNP (last 3 results)  Recent Labs  01/08/15 1719  BNP 17.1   CBG:  Recent Labs Lab 01/10/15 1157 01/10/15 1702 01/10/15 2245 01/11/15 0756 01/11/15 1154  GLUCAP 161* 228* 273* 221* 338*    Signed:  Barton Dubois  [facility] 01/11/2015, 4:42 PM

## 2015-01-11 NOTE — Progress Notes (Signed)
Inpatient Diabetes Program Recommendations  AACE/ADA: New Consensus Statement on Inpatient Glycemic Control (2013)  Target Ranges:  Prepandial:   less than 140 mg/dL      Peak postprandial:   less than 180 mg/dL (1-2 hours)      Critically ill patients:  140 - 180 mg/dL     Results for [name], Scott Figueroa (MRN [id number]) as of 01/11/2015 14:59  Ref. Range 01/10/2015 07:25 01/10/2015 11:57 01/10/2015 17:02 01/10/2015 22:45  Glucose-Capillary Latest Ref Range: 65-99 mg/dL 409136 (H) 811161 (H) 914228 (H) 273 (H)    Results for [name], Scott Figueroa (MRN [id number]) as of 01/11/2015 14:59  Ref. Range 01/11/2015 07:56 01/11/2015 11:54  Glucose-Capillary Latest Ref Range: 65-99 mg/dL 086221 (H) 578338 (H)    Admit with: Weakness/ CP/ DKA/ New diagnosis of DM  History: HTN, COPD  Current DM Orders: Lantus 20 units daily              Novolog Moderate SSI (0-15 units) TID AC +HS    **Has been seen by DM Coordinator.  RNs providing DM education with patient at bedside.  **DM Coordinator assisted with insulin pen teaching yesterday (06/30).  During my visit with patient today, I had patient demonstrate proper insulin pen use again.  Patient was able to correctly demonstrate use of insulin pen with minimal prompts.  Expect good compliance with insulin use at home.   MD- Please consider the following insulin adjustments:  1. Increase Lantus to 25 units daily (0.25 units/kg)  2. Add Novolog Meal Coverage- Novolog 3 units tid with meals    MD- If you decide to send patient home on Insulin, please give patient Rxs for Insulin pens and pen needles: 1. Lantus pen- Order # [id number] 2. Novolog pen- Order # [id number] 3. Insulin Pen needles 31g x 5mm- Order # [id number] 4. CBG Meter and Supplies- Order # [id number]   Will follow Ambrose FinlandJeannine Johnston Hasana Alcorta RN, MSN, CDE Diabetes Coordinator Inpatient Glycemic Control Team Team Pager: [phone] (8a-5p)

## 2015-01-11 NOTE — Care Management Note (Signed)
Case Management Note  Patient Details  Name: Scott Figueroa MRN: [id number] Date of Birth: 09/23/1950  Subjective/Objective:    Patient is for possible dc today, will Need HHRN, [facility] notified.  Patient's pcp is Dr. Orvan Falconerampbell.  Soc will begin 24-48 hrs post dc.                Action/Plan:   Expected Discharge Date:                  Expected Discharge Plan:  Home w Home Health Services  In-House Referral:     Discharge planning Services  CM Consult  Post Acute Care Choice:  Home Health Choice offered to:     DME Arranged:    DME Agency:     HH Arranged:  RN HH Agency:  [facility]  Status of Service:  In process, will continue to follow  Medicare Important Message Given:    Date Medicare IM Given:    Medicare IM give by:    Date Additional Medicare IM Given:    Additional Medicare Important Message give by:     If discussed at Long Length of Stay Meetings, dates discussed:    Additional Comments:  Leone Havenaylor, Raphaella Larkin Clinton, RN 01/11/2015, 12:35 PM

## 2015-01-12 NOTE — Progress Notes (Signed)
Utilization Review completed. Lawerance Sabalebbie Venus Ruhe RN BSN CM  Retro review

## 2015-01-29 ENCOUNTER — Encounter: Payer: Medicaid Other | Attending: Internal Medicine | Admitting: *Deleted

## 2015-01-29 ENCOUNTER — Encounter: Payer: Self-pay | Admitting: *Deleted

## 2015-01-29 VITALS — Ht 70.0 in | Wt 230.5 lb

## 2015-01-29 DIAGNOSIS — Z713 Dietary counseling and surveillance: Secondary | ICD-10-CM | POA: Diagnosis not present

## 2015-01-29 DIAGNOSIS — E119 Type 2 diabetes mellitus without complications: Secondary | ICD-10-CM | POA: Insufficient documentation

## 2015-01-29 DIAGNOSIS — Z6833 Body mass index (BMI) 33.0-33.9, adult: Secondary | ICD-10-CM | POA: Diagnosis not present

## 2015-01-29 NOTE — Progress Notes (Signed)
Diabetes Self-Management Education  Visit Type: First/Initial (DX 12/2014)  Appt. Start Time: 1000 Appt. End Time: 1130  01/29/2015  Mr. Scott Figueroa, identified by name and date of birth, is a 64 y.o. male with a diagnosis of Diabetes: Type 2.  Other people present during visit:  Patient, Companion [name]. [name] was recently hospitalized with diagnosis of new onset T2DM. He has a home Health Nurse that comes to the house 3 times weekly. Her last day is Friday. [name] does the shopping and food preparation. They do not have transportation and are dependent on family. [name] verbalized a desire for Minnie to be treated by an endocrinologist. They have requested a referral that was sent to [facility]. Unfortunately [name] has Medicaid and will probably not be able to obtain an appointment at this time. I suggested they communicate with PCP office.  ASSESSMENT  Height 5\' 10"  (1.778 m), weight 230 lb 8 oz (104.554 kg). Body mass index is 33.07 kg/(m^2).  Initial Visit Information:  Are you currently following a meal plan?: No (is eating more frequently) Are you taking your medications as prescribed?: Yes Are you checking your feet?: Yes How many days per week are you checking your feet?: 7 How often do you need to have someone help you when you read instructions, pamphlets, or other written materials from your doctor or pharmacy?: 1 - Never What is the last grade level you completed in school?: 10th grade  Psychosocial:   Patient Belief/Attitude about Diabetes: Motivated to manage diabetes Self-care barriers: Lack of transportation Self-management support: Doctor's office, Friends, CDE visits Other persons present: Patient, Companion Patient Concerns: Nutrition/Meal planning, Healthy Lifestyle, Glycemic Control Special Needs: None Preferred Learning Style: No preference indicated Learning Readiness: Change in progress  Complications:   Last HgB A1C per  patient/outside source: 12 mg/dL How often do you check your blood sugar?: > 4 times/day Fasting Blood glucose range (mg/dL): 16-10970-129, >604>200 ([id number]) Postprandial Blood glucose range (mg/dL): 19-14770-129, >829>200 ([id number]) Number of hypoglycemic episodes per month: 0 Number of hyperglycemic episodes per week: 100 Can you tell when your blood sugar is high?: Yes What do you do if your blood sugar is high?: thirts, urinating, blurred vision. Have you had a dilated eye exam in the past 12 months?: No Have you had a dental exam in the past 12 months?: No  Diet Intake:  Breakfast: 9:30  scrambled eggs/ cheese / grits /  coffee,powder creamer Snack (morning): fruit / sugar free pudding or jello Lunch: 1:30  Malaw[place]urkey sandwich on whole wheat bread, mayonaise, vegetable, salad, unsweet ice tea or water Dinner: 5:30   green beans, chicken, rice with vegetables Snack (evening): sherbert Beverage(s): coffee, water, unsweet tea,  Exercise:  Exercise: ADL's (Respiratory condition / oxygen)  Individualized Plan for Diabetes Self-Management Training:   Learning Objective:  Patient will have a greater understanding of diabetes self-management. Patient education plan per assessed needs and concerns is to attend individual sessions      Education Topics Reviewed with Patient Today:  Definition of diabetes, type 1 and 2, and the diagnosis of diabetes, Factors that contribute to the development of diabetes Role of diet in the treatment of diabetes and the relationship between the three main macronutrients and blood glucose level, Food label reading, portion sizes and measuring food., Carbohydrate counting, Meal options for control of blood glucose level and chronic complications. Role of exercise on diabetes management, blood pressure control and cardiac health. Identified appropriate SMBG and/or A1C goals., Daily foot  exams, Yearly dilated eye exam Taught treatment of hypoglycemia - the 15 rule. Relationship  between chronic complications and blood glucose control, Assessed and discussed foot care and prevention of foot problems, Dental care  PATIENTS GOALS/Plan (Developed by the patient):  Nutrition: General guidelines for healthy choices and portions discussed Medications: take my medication as prescribed Monitoring : test my blood glucose as discussed (note x per day with comment) (4 times daily) Reducing Risk: do foot checks daily, treat hypoglycemia with 15 grams of carbs if blood glucose less than /dL   Patient Instructions  Plan:  Aim for 2-3 Carb Choices per meal (30-45 grams) +/- 1 either way  Aim for 0-15 Carbs per snack if hungry  Include protein in moderation with your meals and snacks Consider reading food labels for Total Carbohydrate and Fat Grams of foods Consider  increasing your activity level  as tolerated Continue checking BG at alternate times per day to include always do fasting and before bed. Consider doing one before a meal and one after a meal for a total of 4 times per day.  Continue taking medication  as directed by MD  Always carry: glucometer, water, glucose tablets, snack  Take meal insulin approximately 10-15 minute before your meal  Discontinue Sherbert it is pure sugar. OK Ocean Spray Diet Cranberry Juice Consider Brummel & Brown rather than butter on your baked potato   Expected Outcomes:  Demonstrated interest in learning. Expect positive outcomes  Education material provided: Living Well with Diabetes, A1C conversion sheet, Meal plan card, My Plate, Snack sheet and Support group flyer  If problems or questions, patient to contact team via:  Phone  Future DSME appointment: 4-6 wks

## 2015-01-29 NOTE — Patient Instructions (Addendum)
Plan:  Aim for 2-3 Carb Choices per meal (30-45 grams) +/- 1 either way  Aim for 0-15 Carbs per snack if hungry  Include protein in moderation with your meals and snacks Consider reading food labels for Total Carbohydrate and Fat Grams of foods Consider  increasing your activity level  as tolerated Continue checking BG at alternate times per day to include always do fasting and before bed. Consider doing one before a meal and one after a meal for a total of 4 times per day.  Continue taking medication  as directed by MD  Always carry: glucometer, water, glucose tablets, snack  Take meal insulin approximately 10-15 minute before your meal  Discontinue Sherbert it is pure sugar. OK Ocean Spray Diet Cranberry Juice Consider Brummel & Brown rather than butter on your baked potato

## 2015-02-11 ENCOUNTER — Encounter: Payer: Self-pay | Admitting: Podiatry

## 2015-02-11 ENCOUNTER — Ambulatory Visit (INDEPENDENT_AMBULATORY_CARE_PROVIDER_SITE_OTHER): Payer: Medicaid Other | Admitting: Podiatry

## 2015-02-11 VITALS — BP 99/61 | HR 85 | Resp 18

## 2015-02-11 DIAGNOSIS — M79676 Pain in unspecified toe(s): Secondary | ICD-10-CM

## 2015-02-11 DIAGNOSIS — B351 Tinea unguium: Secondary | ICD-10-CM | POA: Diagnosis not present

## 2015-02-11 NOTE — Progress Notes (Signed)
   Subjective:    Patient ID: Scott Figueroa, male    DOB: [birth year], 64 y.o.   MRN: [id number]  HPI I AM A DIABETIC AND NEED MY FEET CHECKED AND ALSO MY TOENAILS TRIMMED AND I AM NOW A DIABETIC AND HAVE BEEN FOR ABOUT A MONTH AND THERE IS NO NUMBNESS OR TINGLING, NO BURNING AND NO THROBBING, AND NO SWELLING AND MY NAILS ARE THICK AND DISCOLORED AND CURLING IN   This diabetic patient presents for foot evaluation.  He says he has occasional gout which is relieved by taking unknown gout medicine.  He also says the medicine controls his swelling in his feet.  His nails are thick and long and painful walking and wearing his shoes.  Review of Systems  All other systems reviewed and are negative.      Objective:   Physical Exam GENERAL APPEARANCE: Alert, conversant. Appropriately groomed. No acute distress.  VASCULAR: Pedal pulses palpable at 2/4 DP and PT bilateral.  Capillary refill time is immediate to all digits,  Proximal to distal cooling it warm to warm.  Digital hair growth is present bilateral  NEUROLOGIC: sensation is intact epicritically and protectively to 5.07 monofilament at 5/5 sites bilateral.  Light touch is intact bilateral, vibratory sensation intact bilateral, achilles tendon reflex is intact bilateral.  MUSCULOSKELETAL: acceptable muscle strength, tone and stability bilateral.  Intrinsic muscluature intact bilateral.  Rectus appearance of foot and digits noted bilateral.   DERMATOLOGIC: skin color, texture, and turgor are within normal limits.  No preulcerative lesions or ulcers  are seen, no interdigital maceration noted.  No open lesions present.   No drainage noted. NAILS  Thick dsfigured discolored nails both feet.         Assessment & Plan:  Onychomycosis  B/L  IE  Debridement and Grinding of Long Thick nails.

## 2015-03-14 ENCOUNTER — Ambulatory Visit: Payer: Medicaid Other | Admitting: *Deleted

## 2015-05-13 ENCOUNTER — Ambulatory Visit: Payer: Medicaid Other | Admitting: Podiatry

## 2015-08-05 IMAGING — US US ABDOMEN COMPLETE
1 series · 13 of 25 positions shown · non-contrast
Comparison: None.

CLINICAL DATA: Epigastric abdominal pain for the past 10 days

EXAM:
ULTRASOUND ABDOMEN COMPLETE

[Series 1: us abdomen complete · 0.21mm/px · 13 of 77 slices shown]
[im 1/77]
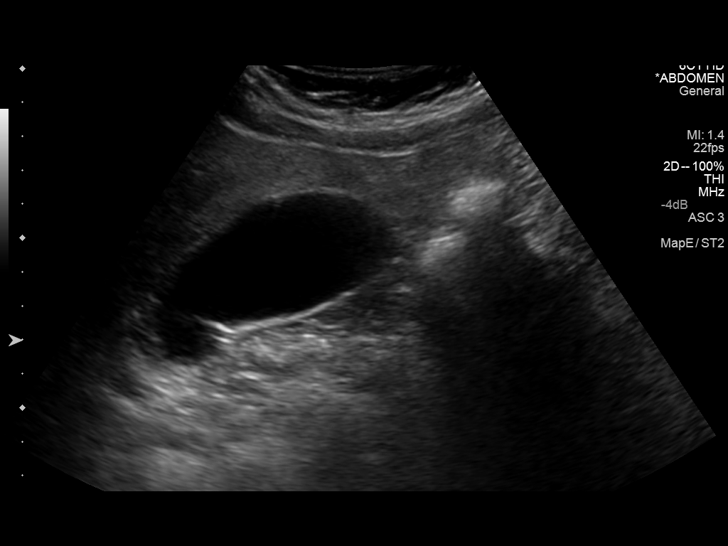
[im 7/77]
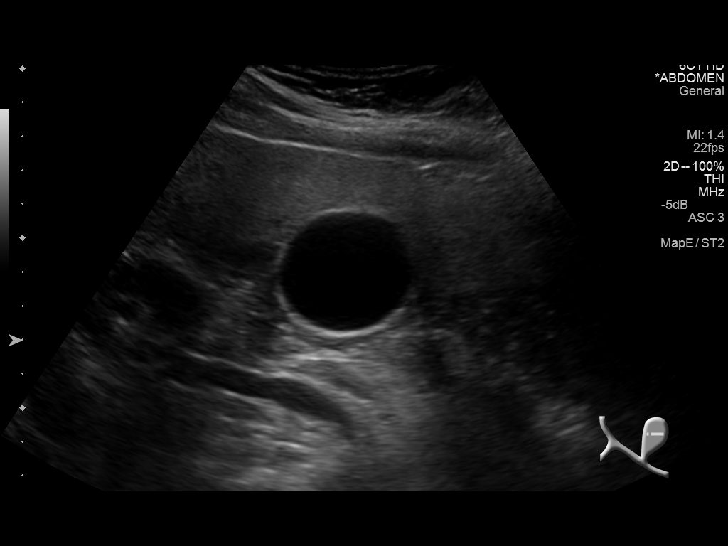
[im 13/77]
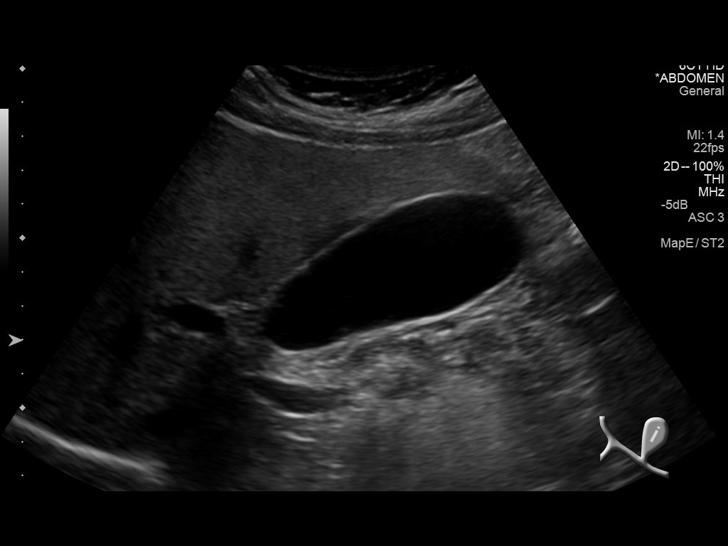
[im 20/77]
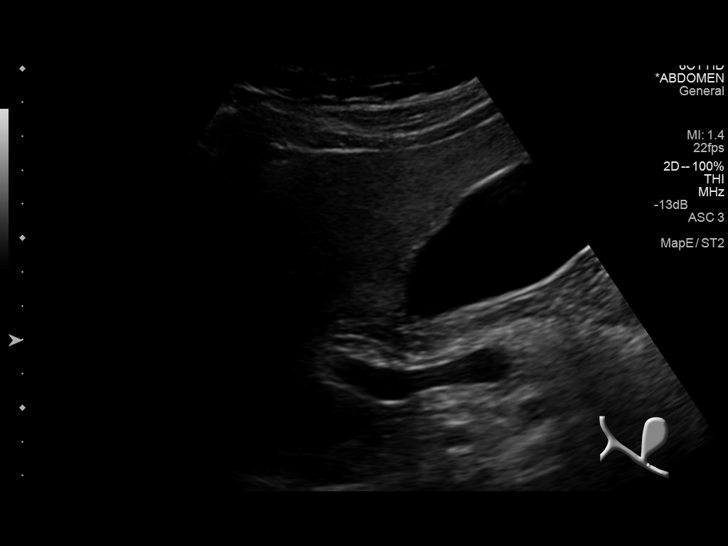
[im 26/77]
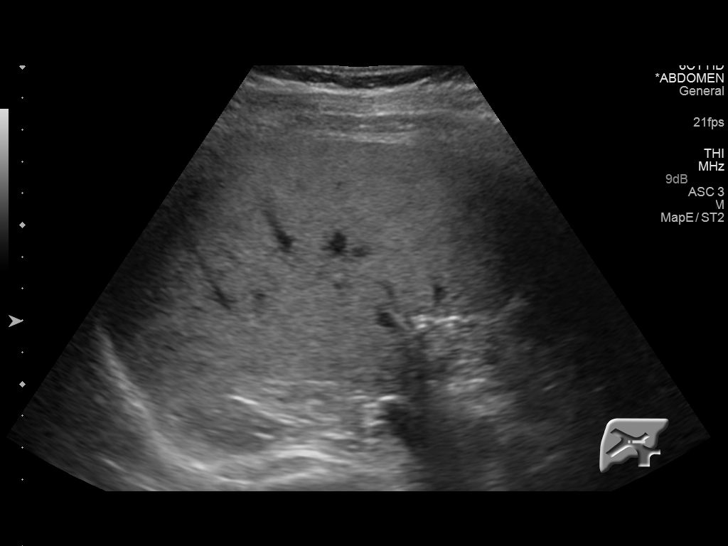
[im 32/77]
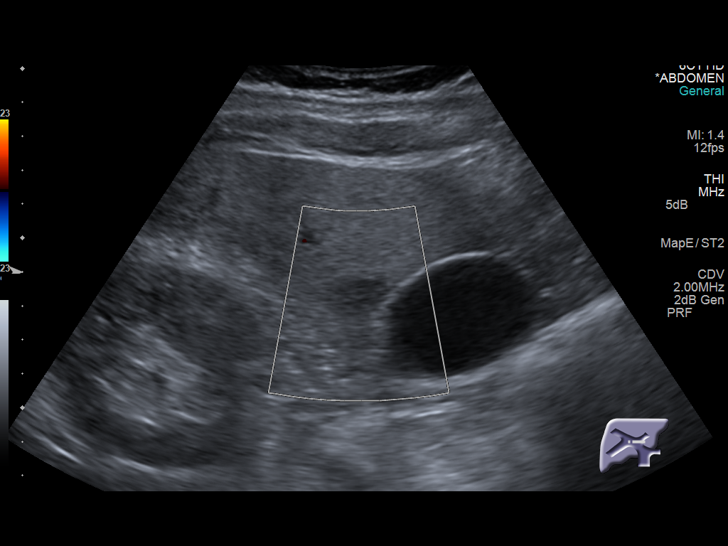
[im 39/77]
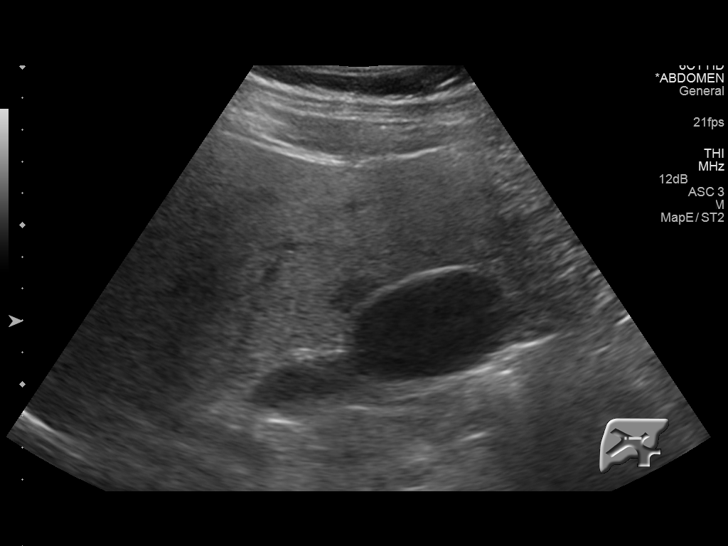
[im 45/77]
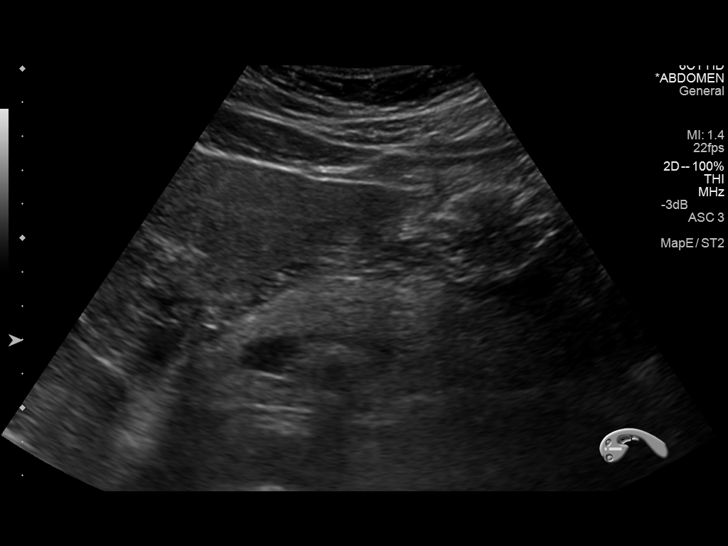
[im 51/77]
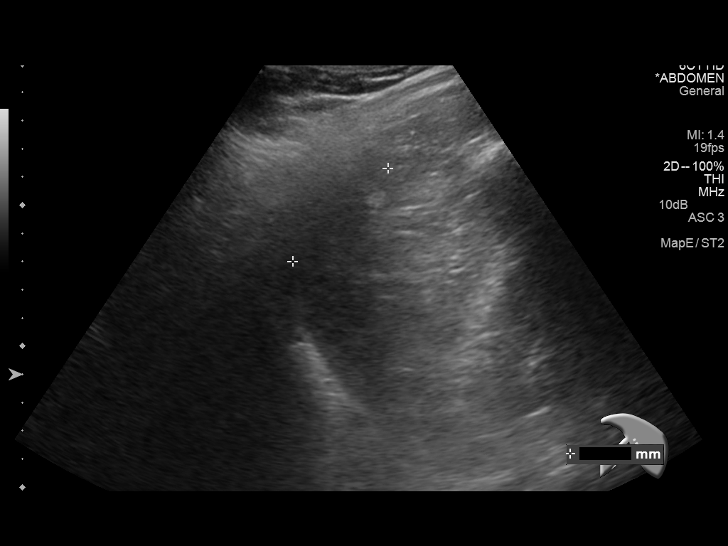
[im 58/77]
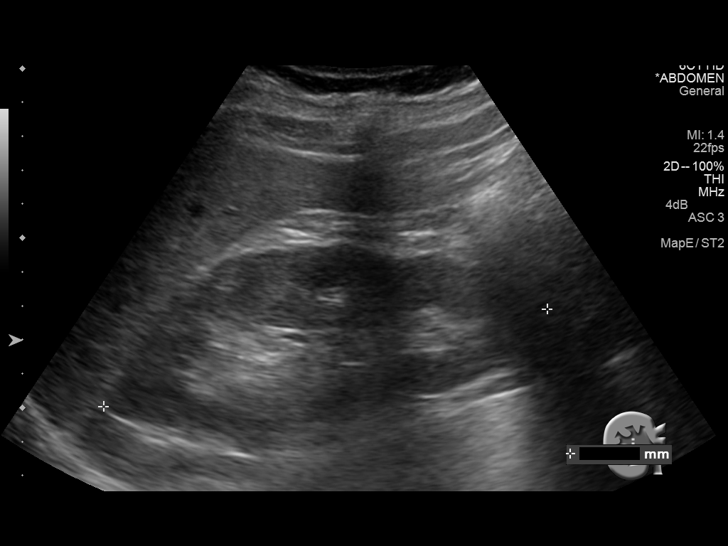
[im 64/77]
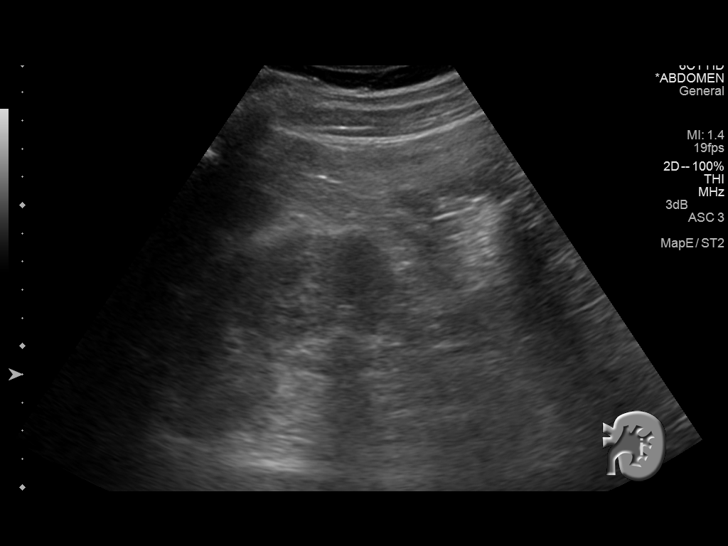
[im 70/77]
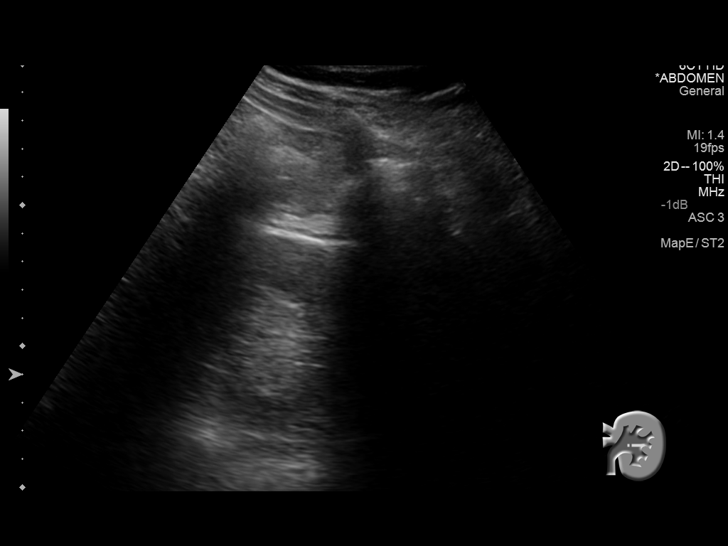
[im 77/77]
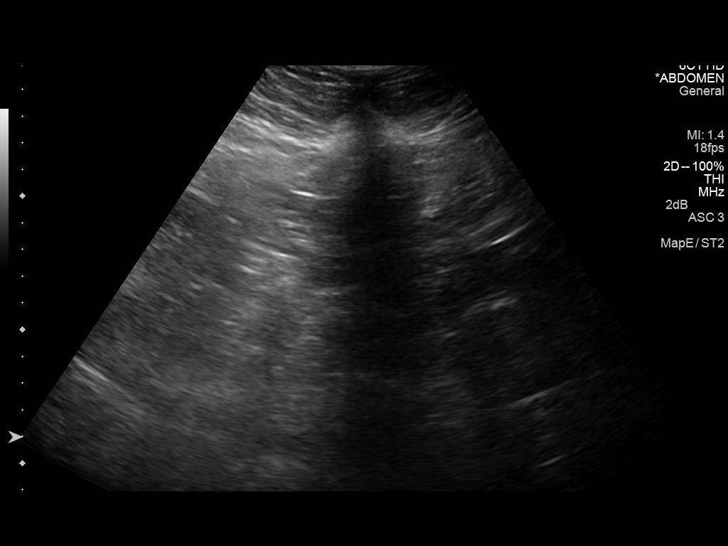

[13 of 25 positions shown; findings below may reference images not displayed]

FINDINGS: Gallbladder: No gallstones or wall thickening visualized. No
sonographic Murphy sign noted.

Common bile duct: Diameter: 3.5 mm

Liver: The hepatic echotexture is mildly increased. Adjacent to the
gallbladder there is a 2.6 x 2.2 x 1.9 cm focus of decreased
echogenicity. This may reflect focal fatty sparing. There is no
intrahepatic ductal dilation.

IVC: No abnormality visualized.

Pancreas: Visualized portion unremarkable.

Spleen: Size and appearance within normal limits.

Right Kidney: Length: 13.4 cm. Echogenicity within normal limits. No
mass or hydronephrosis visualized.

Left Kidney: Length: 12.4 cm. Echogenicity within normal limits. No
mass or hydronephrosis visualized.

Abdominal aorta: No aneurysm visualized.

Other findings: No free fluid
IMPRESSION: 1. Fatty infiltrative change of the liver. Probable focal fatty
sparing adjacent to the gallbladder fossa.
2. There is no evidence of acute cholecystitis. If there remain
clinical concerns of gallbladder dysfunction, a nuclear medicine
hepatobiliary scan would be useful.
3. No acute abnormality is observed elsewhere within the abdomen.

## 2016-08-26 DIAGNOSIS — E782 Mixed hyperlipidemia: Secondary | ICD-10-CM

## 2016-08-26 DIAGNOSIS — E559 Vitamin D deficiency, unspecified: Secondary | ICD-10-CM | POA: Insufficient documentation

## 2016-08-26 DIAGNOSIS — F17200 Nicotine dependence, unspecified, uncomplicated: Secondary | ICD-10-CM | POA: Insufficient documentation

## 2016-08-26 DIAGNOSIS — E785 Hyperlipidemia, unspecified: Secondary | ICD-10-CM | POA: Insufficient documentation

## 2016-08-26 HISTORY — DX: Vitamin D deficiency, unspecified: E55.9

## 2016-08-26 HISTORY — DX: Mixed hyperlipidemia: E78.2

## 2017-02-02 DIAGNOSIS — Z6832 Body mass index (BMI) 32.0-32.9, adult: Secondary | ICD-10-CM | POA: Diagnosis not present

## 2017-02-02 DIAGNOSIS — I10 Essential (primary) hypertension: Secondary | ICD-10-CM | POA: Diagnosis not present

## 2017-02-02 DIAGNOSIS — M47815 Spondylosis without myelopathy or radiculopathy, thoracolumbar region: Secondary | ICD-10-CM | POA: Diagnosis not present

## 2017-02-02 DIAGNOSIS — J449 Chronic obstructive pulmonary disease, unspecified: Secondary | ICD-10-CM | POA: Diagnosis not present

## 2017-02-02 DIAGNOSIS — E1165 Type 2 diabetes mellitus with hyperglycemia: Secondary | ICD-10-CM | POA: Diagnosis not present

## 2017-02-02 DIAGNOSIS — K219 Gastro-esophageal reflux disease without esophagitis: Secondary | ICD-10-CM | POA: Diagnosis not present

## 2017-02-02 DIAGNOSIS — Z79899 Other long term (current) drug therapy: Secondary | ICD-10-CM | POA: Diagnosis not present

## 2017-03-23 DIAGNOSIS — Z23 Encounter for immunization: Secondary | ICD-10-CM | POA: Diagnosis not present

## 2017-03-23 DIAGNOSIS — E1165 Type 2 diabetes mellitus with hyperglycemia: Secondary | ICD-10-CM | POA: Diagnosis not present

## 2017-03-23 DIAGNOSIS — I10 Essential (primary) hypertension: Secondary | ICD-10-CM | POA: Diagnosis not present

## 2017-03-23 DIAGNOSIS — E782 Mixed hyperlipidemia: Secondary | ICD-10-CM | POA: Diagnosis not present

## 2017-05-22 DIAGNOSIS — M1612 Unilateral primary osteoarthritis, left hip: Secondary | ICD-10-CM | POA: Diagnosis not present

## 2017-05-22 DIAGNOSIS — M25552 Pain in left hip: Secondary | ICD-10-CM | POA: Diagnosis not present

## 2017-06-07 DIAGNOSIS — M47815 Spondylosis without myelopathy or radiculopathy, thoracolumbar region: Secondary | ICD-10-CM | POA: Diagnosis not present

## 2017-06-07 DIAGNOSIS — Z1331 Encounter for screening for depression: Secondary | ICD-10-CM | POA: Diagnosis not present

## 2017-06-07 DIAGNOSIS — Z23 Encounter for immunization: Secondary | ICD-10-CM | POA: Diagnosis not present

## 2017-06-07 DIAGNOSIS — I10 Essential (primary) hypertension: Secondary | ICD-10-CM | POA: Diagnosis not present

## 2017-06-07 DIAGNOSIS — Z1211 Encounter for screening for malignant neoplasm of colon: Secondary | ICD-10-CM | POA: Diagnosis not present

## 2017-06-07 DIAGNOSIS — Z9181 History of falling: Secondary | ICD-10-CM | POA: Diagnosis not present

## 2017-06-07 DIAGNOSIS — J449 Chronic obstructive pulmonary disease, unspecified: Secondary | ICD-10-CM | POA: Diagnosis not present

## 2017-06-07 DIAGNOSIS — M5432 Sciatica, left side: Secondary | ICD-10-CM | POA: Diagnosis not present

## 2017-06-07 DIAGNOSIS — Z79899 Other long term (current) drug therapy: Secondary | ICD-10-CM | POA: Diagnosis not present

## 2017-06-07 DIAGNOSIS — E1165 Type 2 diabetes mellitus with hyperglycemia: Secondary | ICD-10-CM | POA: Diagnosis not present

## 2017-06-07 DIAGNOSIS — Z6830 Body mass index (BMI) 30.0-30.9, adult: Secondary | ICD-10-CM | POA: Diagnosis not present

## 2017-06-07 DIAGNOSIS — K219 Gastro-esophageal reflux disease without esophagitis: Secondary | ICD-10-CM | POA: Diagnosis not present

## 2017-06-29 DIAGNOSIS — E559 Vitamin D deficiency, unspecified: Secondary | ICD-10-CM | POA: Diagnosis not present

## 2017-06-29 DIAGNOSIS — Z794 Long term (current) use of insulin: Secondary | ICD-10-CM | POA: Diagnosis not present

## 2017-06-29 DIAGNOSIS — I10 Essential (primary) hypertension: Secondary | ICD-10-CM | POA: Diagnosis not present

## 2017-06-29 DIAGNOSIS — E782 Mixed hyperlipidemia: Secondary | ICD-10-CM | POA: Diagnosis not present

## 2017-06-29 DIAGNOSIS — E1165 Type 2 diabetes mellitus with hyperglycemia: Secondary | ICD-10-CM | POA: Diagnosis not present

## 2017-07-15 ENCOUNTER — Other Ambulatory Visit: Payer: Self-pay

## 2017-07-15 NOTE — Patient Outreach (Signed)
[facility] Care Management  07/15/2017  Scott Figueroa 04-30-1951 [id number]   Medication Adherence call to Mr. Scott Figueroa  Patient is showing past due under [facility] [employer]on Atorvastatin 10 mg and Benazepril/Hctz 20/25 mg spoke with patient he said he did not have any money to pay for his medication.[facility] pay for all his medication a total of 8 prescription.Dawn the pharmacist drove to [facility] in [place]  and pay for all his medication.   Scott Figueroa CPhT Pharmacy Technician [facility] Care Management Direct Dial [phone]  Fax [phone] [email or web address]

## 2017-09-08 DIAGNOSIS — E1165 Type 2 diabetes mellitus with hyperglycemia: Secondary | ICD-10-CM | POA: Diagnosis not present

## 2017-09-08 DIAGNOSIS — M47815 Spondylosis without myelopathy or radiculopathy, thoracolumbar region: Secondary | ICD-10-CM | POA: Diagnosis not present

## 2017-09-08 DIAGNOSIS — K219 Gastro-esophageal reflux disease without esophagitis: Secondary | ICD-10-CM | POA: Diagnosis not present

## 2017-09-08 DIAGNOSIS — J449 Chronic obstructive pulmonary disease, unspecified: Secondary | ICD-10-CM | POA: Diagnosis not present

## 2017-09-08 DIAGNOSIS — I10 Essential (primary) hypertension: Secondary | ICD-10-CM | POA: Diagnosis not present

## 2017-09-27 DIAGNOSIS — E1165 Type 2 diabetes mellitus with hyperglycemia: Secondary | ICD-10-CM | POA: Diagnosis not present

## 2017-09-27 DIAGNOSIS — E559 Vitamin D deficiency, unspecified: Secondary | ICD-10-CM | POA: Diagnosis not present

## 2017-09-27 DIAGNOSIS — E782 Mixed hyperlipidemia: Secondary | ICD-10-CM | POA: Diagnosis not present

## 2017-09-27 DIAGNOSIS — I10 Essential (primary) hypertension: Secondary | ICD-10-CM | POA: Diagnosis not present

## 2017-09-27 DIAGNOSIS — Z794 Long term (current) use of insulin: Secondary | ICD-10-CM | POA: Diagnosis not present

## 2017-11-25 DIAGNOSIS — E119 Type 2 diabetes mellitus without complications: Secondary | ICD-10-CM | POA: Diagnosis not present

## 2017-12-01 DIAGNOSIS — H5213 Myopia, bilateral: Secondary | ICD-10-CM | POA: Diagnosis not present

## 2018-05-25 DIAGNOSIS — E782 Mixed hyperlipidemia: Secondary | ICD-10-CM | POA: Diagnosis not present

## 2018-05-25 DIAGNOSIS — Z794 Long term (current) use of insulin: Secondary | ICD-10-CM | POA: Diagnosis not present

## 2018-05-25 DIAGNOSIS — E1165 Type 2 diabetes mellitus with hyperglycemia: Secondary | ICD-10-CM | POA: Diagnosis not present

## 2018-12-19 DIAGNOSIS — E782 Mixed hyperlipidemia: Secondary | ICD-10-CM | POA: Diagnosis not present

## 2018-12-19 DIAGNOSIS — E559 Vitamin D deficiency, unspecified: Secondary | ICD-10-CM | POA: Diagnosis not present

## 2018-12-19 DIAGNOSIS — E1165 Type 2 diabetes mellitus with hyperglycemia: Secondary | ICD-10-CM | POA: Diagnosis not present

## 2018-12-19 DIAGNOSIS — Z794 Long term (current) use of insulin: Secondary | ICD-10-CM | POA: Diagnosis not present

## 2018-12-19 DIAGNOSIS — I10 Essential (primary) hypertension: Secondary | ICD-10-CM | POA: Diagnosis not present

## 2019-01-23 DIAGNOSIS — E1165 Type 2 diabetes mellitus with hyperglycemia: Secondary | ICD-10-CM | POA: Diagnosis not present

## 2019-01-23 DIAGNOSIS — Z9181 History of falling: Secondary | ICD-10-CM | POA: Diagnosis not present

## 2019-01-23 DIAGNOSIS — I10 Essential (primary) hypertension: Secondary | ICD-10-CM | POA: Diagnosis not present

## 2019-01-23 DIAGNOSIS — J449 Chronic obstructive pulmonary disease, unspecified: Secondary | ICD-10-CM | POA: Diagnosis not present

## 2019-01-23 DIAGNOSIS — Z139 Encounter for screening, unspecified: Secondary | ICD-10-CM | POA: Diagnosis not present

## 2019-12-13 DIAGNOSIS — E1165 Type 2 diabetes mellitus with hyperglycemia: Secondary | ICD-10-CM | POA: Diagnosis not present

## 2019-12-13 DIAGNOSIS — Z87891 Personal history of nicotine dependence: Secondary | ICD-10-CM | POA: Diagnosis not present

## 2019-12-13 DIAGNOSIS — I10 Essential (primary) hypertension: Secondary | ICD-10-CM | POA: Diagnosis not present

## 2019-12-13 DIAGNOSIS — J449 Chronic obstructive pulmonary disease, unspecified: Secondary | ICD-10-CM | POA: Diagnosis not present

## 2019-12-13 DIAGNOSIS — Z79899 Other long term (current) drug therapy: Secondary | ICD-10-CM | POA: Diagnosis not present

## 2019-12-25 DIAGNOSIS — Z794 Long term (current) use of insulin: Secondary | ICD-10-CM | POA: Diagnosis not present

## 2019-12-25 DIAGNOSIS — E559 Vitamin D deficiency, unspecified: Secondary | ICD-10-CM | POA: Diagnosis not present

## 2019-12-25 DIAGNOSIS — E1165 Type 2 diabetes mellitus with hyperglycemia: Secondary | ICD-10-CM | POA: Diagnosis not present

## 2019-12-25 DIAGNOSIS — E782 Mixed hyperlipidemia: Secondary | ICD-10-CM | POA: Diagnosis not present

## 2019-12-25 DIAGNOSIS — I10 Essential (primary) hypertension: Secondary | ICD-10-CM | POA: Diagnosis not present

## 2020-01-10 DIAGNOSIS — Z9181 History of falling: Secondary | ICD-10-CM | POA: Diagnosis not present

## 2020-01-10 DIAGNOSIS — E785 Hyperlipidemia, unspecified: Secondary | ICD-10-CM | POA: Diagnosis not present

## 2020-01-10 DIAGNOSIS — Z0000 Encounter for general adult medical examination without abnormal findings: Secondary | ICD-10-CM | POA: Diagnosis not present

## 2020-01-30 DIAGNOSIS — E119 Type 2 diabetes mellitus without complications: Secondary | ICD-10-CM | POA: Diagnosis not present

## 2020-01-31 DIAGNOSIS — H5213 Myopia, bilateral: Secondary | ICD-10-CM | POA: Diagnosis not present

## 2020-02-08 DIAGNOSIS — E113291 Type 2 diabetes mellitus with mild nonproliferative diabetic retinopathy without macular edema, right eye: Secondary | ICD-10-CM | POA: Diagnosis not present

## 2020-03-07 ENCOUNTER — Encounter: Payer: Self-pay | Admitting: Gastroenterology

## 2020-04-12 ENCOUNTER — Telehealth: Payer: Self-pay | Admitting: *Deleted

## 2020-04-12 ENCOUNTER — Other Ambulatory Visit: Payer: Self-pay

## 2020-04-12 ENCOUNTER — Ambulatory Visit: Payer: Medicare HMO

## 2020-04-12 NOTE — Telephone Encounter (Signed)
Pt here for his PV.  He states he wears oxygen PRN.  Colonoscopy for 05-02-20 at [facility] cancelled.  OV made with Dr. Chales Abrahams 06-10-20 at 11:00 am.  Pt denies having any changes in bowel habits, bleeding or pain.

## 2020-05-02 ENCOUNTER — Encounter: Payer: Self-pay | Admitting: Gastroenterology

## 2020-06-10 ENCOUNTER — Ambulatory Visit: Payer: Medicare HMO | Admitting: Gastroenterology

## 2020-07-23 ENCOUNTER — Ambulatory Visit: Payer: Medicare HMO | Admitting: Gastroenterology

## 2020-08-02 ENCOUNTER — Encounter: Payer: Self-pay | Admitting: Internal Medicine

## 2020-10-28 DIAGNOSIS — J449 Chronic obstructive pulmonary disease, unspecified: Secondary | ICD-10-CM | POA: Diagnosis not present

## 2020-11-27 DIAGNOSIS — J449 Chronic obstructive pulmonary disease, unspecified: Secondary | ICD-10-CM | POA: Diagnosis not present

## 2020-12-28 DIAGNOSIS — J449 Chronic obstructive pulmonary disease, unspecified: Secondary | ICD-10-CM | POA: Diagnosis not present

## 2021-01-27 DIAGNOSIS — J449 Chronic obstructive pulmonary disease, unspecified: Secondary | ICD-10-CM | POA: Diagnosis not present

## 2021-02-19 DIAGNOSIS — Z9181 History of falling: Secondary | ICD-10-CM | POA: Diagnosis not present

## 2021-02-19 DIAGNOSIS — Z7689 Persons encountering health services in other specified circumstances: Secondary | ICD-10-CM | POA: Diagnosis not present

## 2021-02-19 DIAGNOSIS — J449 Chronic obstructive pulmonary disease, unspecified: Secondary | ICD-10-CM | POA: Diagnosis not present

## 2021-02-19 DIAGNOSIS — Z79899 Other long term (current) drug therapy: Secondary | ICD-10-CM | POA: Diagnosis not present

## 2021-02-19 DIAGNOSIS — I10 Essential (primary) hypertension: Secondary | ICD-10-CM | POA: Diagnosis not present

## 2021-02-19 DIAGNOSIS — K219 Gastro-esophageal reflux disease without esophagitis: Secondary | ICD-10-CM | POA: Diagnosis not present

## 2021-02-19 DIAGNOSIS — M47815 Spondylosis without myelopathy or radiculopathy, thoracolumbar region: Secondary | ICD-10-CM | POA: Diagnosis not present

## 2021-02-19 DIAGNOSIS — Z87891 Personal history of nicotine dependence: Secondary | ICD-10-CM | POA: Diagnosis not present

## 2021-02-27 DIAGNOSIS — J449 Chronic obstructive pulmonary disease, unspecified: Secondary | ICD-10-CM | POA: Diagnosis not present

## 2021-03-07 DIAGNOSIS — Z0000 Encounter for general adult medical examination without abnormal findings: Secondary | ICD-10-CM | POA: Diagnosis not present

## 2021-03-07 DIAGNOSIS — E785 Hyperlipidemia, unspecified: Secondary | ICD-10-CM | POA: Diagnosis not present

## 2021-03-07 DIAGNOSIS — Z9181 History of falling: Secondary | ICD-10-CM | POA: Diagnosis not present

## 2021-03-12 DIAGNOSIS — Z139 Encounter for screening, unspecified: Secondary | ICD-10-CM | POA: Diagnosis not present

## 2021-03-12 DIAGNOSIS — K219 Gastro-esophageal reflux disease without esophagitis: Secondary | ICD-10-CM | POA: Diagnosis not present

## 2021-03-12 DIAGNOSIS — M47815 Spondylosis without myelopathy or radiculopathy, thoracolumbar region: Secondary | ICD-10-CM | POA: Diagnosis not present

## 2021-03-12 DIAGNOSIS — Z79899 Other long term (current) drug therapy: Secondary | ICD-10-CM | POA: Diagnosis not present

## 2021-03-12 DIAGNOSIS — I10 Essential (primary) hypertension: Secondary | ICD-10-CM | POA: Diagnosis not present

## 2021-03-12 DIAGNOSIS — Z9181 History of falling: Secondary | ICD-10-CM | POA: Diagnosis not present

## 2021-03-12 DIAGNOSIS — Z87891 Personal history of nicotine dependence: Secondary | ICD-10-CM | POA: Diagnosis not present

## 2021-03-12 DIAGNOSIS — E1165 Type 2 diabetes mellitus with hyperglycemia: Secondary | ICD-10-CM | POA: Diagnosis not present

## 2021-03-12 DIAGNOSIS — J449 Chronic obstructive pulmonary disease, unspecified: Secondary | ICD-10-CM | POA: Diagnosis not present

## 2021-03-30 DIAGNOSIS — J449 Chronic obstructive pulmonary disease, unspecified: Secondary | ICD-10-CM | POA: Diagnosis not present

## 2021-04-29 DIAGNOSIS — J449 Chronic obstructive pulmonary disease, unspecified: Secondary | ICD-10-CM | POA: Diagnosis not present

## 2021-05-30 DIAGNOSIS — J449 Chronic obstructive pulmonary disease, unspecified: Secondary | ICD-10-CM | POA: Diagnosis not present

## 2021-06-29 DIAGNOSIS — J449 Chronic obstructive pulmonary disease, unspecified: Secondary | ICD-10-CM | POA: Diagnosis not present

## 2021-07-30 DIAGNOSIS — J449 Chronic obstructive pulmonary disease, unspecified: Secondary | ICD-10-CM | POA: Diagnosis not present

## 2021-08-30 DIAGNOSIS — J449 Chronic obstructive pulmonary disease, unspecified: Secondary | ICD-10-CM | POA: Diagnosis not present

## 2021-09-27 DIAGNOSIS — J449 Chronic obstructive pulmonary disease, unspecified: Secondary | ICD-10-CM | POA: Diagnosis not present

## 2021-10-28 DIAGNOSIS — J449 Chronic obstructive pulmonary disease, unspecified: Secondary | ICD-10-CM | POA: Diagnosis not present

## 2021-11-27 DIAGNOSIS — J449 Chronic obstructive pulmonary disease, unspecified: Secondary | ICD-10-CM | POA: Diagnosis not present

## 2021-12-28 DIAGNOSIS — J449 Chronic obstructive pulmonary disease, unspecified: Secondary | ICD-10-CM | POA: Diagnosis not present

## 2022-01-07 DIAGNOSIS — J449 Chronic obstructive pulmonary disease, unspecified: Secondary | ICD-10-CM | POA: Diagnosis not present

## 2022-01-07 DIAGNOSIS — I10 Essential (primary) hypertension: Secondary | ICD-10-CM | POA: Diagnosis not present

## 2022-01-07 DIAGNOSIS — K219 Gastro-esophageal reflux disease without esophagitis: Secondary | ICD-10-CM | POA: Diagnosis not present

## 2022-01-07 DIAGNOSIS — E1165 Type 2 diabetes mellitus with hyperglycemia: Secondary | ICD-10-CM | POA: Diagnosis not present

## 2022-01-07 DIAGNOSIS — R0609 Other forms of dyspnea: Secondary | ICD-10-CM | POA: Diagnosis not present

## 2022-01-07 DIAGNOSIS — M47815 Spondylosis without myelopathy or radiculopathy, thoracolumbar region: Secondary | ICD-10-CM | POA: Diagnosis not present

## 2022-01-07 DIAGNOSIS — Z87891 Personal history of nicotine dependence: Secondary | ICD-10-CM | POA: Diagnosis not present

## 2022-01-27 DIAGNOSIS — J449 Chronic obstructive pulmonary disease, unspecified: Secondary | ICD-10-CM | POA: Diagnosis not present

## 2022-02-06 ENCOUNTER — Encounter: Payer: Self-pay | Admitting: Cardiology

## 2022-02-06 ENCOUNTER — Encounter: Payer: Self-pay | Admitting: *Deleted

## 2022-02-18 ENCOUNTER — Ambulatory Visit (INDEPENDENT_AMBULATORY_CARE_PROVIDER_SITE_OTHER): Payer: Medicare Other | Admitting: Cardiology

## 2022-02-18 ENCOUNTER — Encounter: Payer: Self-pay | Admitting: Cardiology

## 2022-02-18 VITALS — BP 176/86 | HR 56 | Ht 72.0 in | Wt 226.6 lb

## 2022-02-18 DIAGNOSIS — E782 Mixed hyperlipidemia: Secondary | ICD-10-CM | POA: Diagnosis not present

## 2022-02-18 DIAGNOSIS — Z01812 Encounter for preprocedural laboratory examination: Secondary | ICD-10-CM

## 2022-02-18 DIAGNOSIS — R0609 Other forms of dyspnea: Secondary | ICD-10-CM

## 2022-02-18 DIAGNOSIS — E1110 Type 2 diabetes mellitus with ketoacidosis without coma: Secondary | ICD-10-CM | POA: Diagnosis not present

## 2022-02-18 DIAGNOSIS — R0789 Other chest pain: Secondary | ICD-10-CM

## 2022-02-18 DIAGNOSIS — J438 Other emphysema: Secondary | ICD-10-CM | POA: Diagnosis not present

## 2022-02-18 DIAGNOSIS — R072 Precordial pain: Secondary | ICD-10-CM | POA: Diagnosis not present

## 2022-02-18 LAB — BASIC METABOLIC PANEL
BUN/Creatinine Ratio: 10 (ref 10–24)
BUN: 8 mg/dL (ref 8–27)
CO2: 25 mmol/L (ref 20–29)
Calcium: 9.5 mg/dL (ref 8.6–10.2)
Chloride: 101 mmol/L (ref 96–106)
Creatinine, Ser: 0.79 mg/dL (ref 0.76–1.27)
Glucose: 104 mg/dL — ABNORMAL HIGH (ref 70–99)
Potassium: 4.4 mmol/L (ref 3.5–5.2)
Sodium: 140 mmol/L (ref 134–144)
eGFR: 96 mL/min/{1.73_m2} (ref >59)

## 2022-02-18 MED ORDER — METOPROLOL TARTRATE 25 MG PO TABS: 25.0000 mg | ORAL_TABLET | ORAL | 0 refills | Status: DC

## 2022-02-18 NOTE — Patient Instructions (Addendum)
Medication Instructions:  Your physician recommends that you continue on your current medications as directed. Please refer to the Current Medication list given to you today.  *If you need a refill on your cardiac medications before your next appointment, please call your pharmacy*   Lab Work: Your physician recommends that you return for lab work in: Today for a BMP  If you have labs (blood work) drawn today and your tests are completely normal, you will receive your results only by: MyChart Message (if you have MyChart) OR A paper copy in the mail If you have any lab test that is abnormal or we need to change your treatment, we will call you to review the results.   Testing/Procedures:  Your cardiac CT will be scheduled at one of the below locations:   [facility] [address] [place], [place] [address] [phone]  If scheduled at [facility], please arrive at the [facility] Entrance (Entrance C2) of [facility] 30 minutes prior to test start time. You can use the FREE valet parking offered at entrance C (encouraged to control the heart rate for the test)  Proceed to the [facility] Radiology Department (first floor) to check-in and test prep.  All radiology patients and guests should use entrance C2 at [facility], accessed from [facility], even though the hospital's physical address listed is [address].      Please follow these instructions carefully (unless otherwise directed):  Hold all erectile dysfunction medications at least 3 days (72 hrs) prior to test.  On the Night Before the Test: Be sure to Drink plenty of water. Do not consume any caffeinated/decaffeinated beverages or chocolate 12 hours prior to your test. Do not take any antihistamines 12 hours prior to your test. On the Day of the Test: Drink plenty of water until 1 hour prior to the test. Do not eat any food 4 hours prior to  the test. You may take your regular medications prior to the test.  Take metoprolol (Lopressor) two hours prior to test. HOLD Furosemide/Hydrochlorothiazide morning of the test. After the Test: Drink plenty of water. After receiving IV contrast, you may experience a mild flushed feeling. This is normal. On occasion, you may experience a mild rash up to 24 hours after the test. This is not dangerous. If this occurs, you can take Benadryl 25 mg and increase your fluid intake. If you experience trouble breathing, this can be serious. If it is severe call 911 IMMEDIATELY. If it is mild, please call our office. If you take any of these medications: Glipizide/Metformin, Avandament, Glucavance, please do not take 48 hours after completing test unless otherwise instructed.  We will call to schedule your test 2-4 weeks out understanding that some insurance companies will need an authorization prior to the service being performed.   For non-scheduling related questions, please contact the cardiac imaging nurse navigator should you have any questions/concerns: Rockwell [place], Cardiac Imaging Nurse Navigator [name], Cardiac Imaging Nurse Navigator [facility] Direct Office Dial: [phone]   For scheduling needs, including cancellations and rescheduling, please call [place], [phone].   Your physician has requested that you have an echocardiogram. Echocardiography is a painless test that uses sound waves to create images of your heart. It provides your doctor with information about the size and shape of your heart and how well your heart's chambers and valves are working. This procedure takes approximately one  hour. There are no restrictions for this procedure.    Follow-Up: At [facility], you and your health needs are our priority.  As part of our continuing mission to provide you with exceptional heart care, we have created designated Provider Care Teams.   These Care Teams include your primary Cardiologist (physician) and Advanced Practice Providers (APPs -  Physician Assistants and Nurse Practitioners) who all work together to provide you with the care you need, when you need it.  We recommend signing up for the patient portal called "MyChart".  Sign up information is provided on this After Visit Summary.  MyChart is used to connect with patients for Virtual Visits (Telemedicine).  Patients are able to view lab/test results, encounter notes, upcoming appointments, etc.  Non-urgent messages can be sent to your provider as well.   To learn more about what you can do with MyChart, go to [email or web address].    Your next appointment:   2 month(s)  The format for your next appointment:   In Person  Provider:   Gypsy Balsam, MD    Other Instructions   Important Information About Sugar

## 2022-02-18 NOTE — Addendum Note (Signed)
Addended by: Roddie Mc on: 02/18/2022 09:37 AM   Modules accepted: Orders

## 2022-02-18 NOTE — Progress Notes (Signed)
Cardiology Consultation:    Date:  02/18/2022   ID:  Scott Figueroa, DOB Sep 11, 1950, MRN [id number]  PCP:  Wilmer Floor., MD  Cardiologist:  Gypsy Balsam, MD   Referring MD: Wilmer Floor., MD   Chief Complaint  Patient presents with   Shortness of Breath    Ongoing for years     History of Present Illness:    NYMIR RINGLER is a 71 y.o. male who is being seen today for the evaluation of shortness of breath at the request of Wilmer Floor., MD. past medical history significant for diabetes mellitus diagnosed in 2018, under control, dyslipidemia, essential hypertension, family history of coronary artery disease but not premature, he is a chronic smoker as well as he is a vivid fisherman.  He was referred to [place] because of episode of shortness of breath.  He said shortness of breath with exertion been going on for a while but lately became much worse sometimes once or when he exercise he will get a little tightness in the chest.  He likes to do fishing and anytime he goes fishing he tried to get to the car as [place].  Denies having any dizziness or passing out.  There is minimal swelling of lower extremities.  He does have workup for obstructive sleep apnea done by pulmonologist.  He does have family history of coronary artery disease but not premature.  He does not exercise in any fashion he is not on any special diet.  Sadly still continues to smoke.  Past Medical History:  Diagnosis Date   ARF (acute renal failure) (HCC) 01/08/2015   Arthritis    Asthma    Cataract    COPD (chronic obstructive pulmonary disease) (HCC)    Diabetes mellitus without complication (HCC)    Gastroesophageal reflux disease without esophagitis    Gout    Heart disease    Hyperlipidemia    Hypertension    Mixed dyslipidemia 08/26/2016   Myocardial infarction [facility])    Osteoarthritis    Oxygen deficiency    uses  Oxygen PRN   Type 2 diabetes mellitus with ketoacidosis without coma (HCC)    Vitamin D deficiency 08/26/2016    Past Surgical History:  Procedure Laterality Date   COLONOSCOPY  02/17/2013   Colon polyp-status post polpectomy. Small internal hemorrhoids. Otherwise grossly normal colonoscopy to cecum   left arm surgery     left knee surgery      Current Medications: Current Meds  Medication Sig   albuterol (PROAIR HFA) 108 (90 Base) MCG/ACT inhaler Inhale 2 puffs into the lungs every 6 (six) hours as needed for wheezing or shortness of breath.   allopurinol (ZYLOPRIM) 100 MG tablet Take 100 mg by mouth daily.   amLODipine (NORVASC) 5 MG tablet Take 1 tablet (5 mg total) by mouth daily.   aspirin 81 MG tablet Take 1 tablet (81 mg total) by mouth daily.   benazepril-hydrochlorthiazide (LOTENSIN HCT) 20-25 MG per tablet Take 1 tablet by mouth daily.   buPROPion (WELLBUTRIN SR) 150 MG 12 hr tablet Take 150 mg by mouth daily.   cholecalciferol (VITAMIN D3) 25 MCG (1000 UNIT) tablet Take 1,000 Units by mouth daily.   colchicine 0.6 MG tablet Take 0.6 mg by mouth 2 (two) times daily.   esomeprazole (NEXIUM) 40 MG capsule Take 40 mg by mouth daily at 12 noon.   fluticasone-salmeterol (ADVAIR  DISKUS) 500-50 MCG/ACT AEPB Inhale 1 puff into the lungs in the morning and at bedtime.   insulin glargine (LANTUS) 100 UNIT/ML injection Inject 20 Units into the skin daily.   meloxicam (MOBIC) 15 MG tablet Take 15 mg by mouth daily as needed for pain.   metFORMIN (GLUCOPHAGE) 500 MG tablet Take 500 mg by mouth 2 (two) times daily with a meal.   nabumetone (RELAFEN) 750 MG tablet Take 750 mg by mouth daily.   omeprazole (PRILOSEC) 40 MG capsule Take 40 mg by mouth daily.   polyethylene glycol (MIRALAX / GLYCOLAX) 17 g packet Take 17 g by mouth daily as needed for mild constipation or moderate constipation.   simvastatin (ZOCOR) 20 MG tablet Take 20 mg by mouth daily.   tamsulosin (FLOMAX) 0.4 MG CAPS  capsule Take 0.4 mg by mouth at bedtime.     Allergies:   Patient has no known allergies.   Social History   Socioeconomic History   Marital status: Married    Spouse name: Not on file   Number of children: Not on file   Years of education: Not on file   Highest education level: Not on file  Occupational History   Not on file  Tobacco Use   Smoking status: Every Day    Packs/day: 0.50    Types: Cigarettes   Smokeless tobacco: Former  Building services engineer Use: Former  Substance and Sexual Activity   Alcohol use: Yes    Comment: Occasional   Drug use: No   Sexual activity: Not on file  Other Topics Concern   Not on file  Social History Narrative   Not on file   Social Determinants of Health   Financial Resource Strain: Not on file  Food Insecurity: Not on file  Transportation Needs: Not on file  Physical Activity: Not on file  Stress: Not on file  Social Connections: Not on file     Family History: The patient's family history includes Colon cancer in his sister; Diabetes in his father and mother; Diabetes Mellitus II in his mother; Heart failure in his father; Hypertension in his father and mother. There is no history of Esophageal cancer, Stomach cancer, or Rectal cancer. ROS:   Please see the history of present illness.    All 14 point review of systems negative except as described per history of present illness.  EKGs/Labs/Other Studies Reviewed:    The following studies were reviewed today:   EKG:  EKG is  ordered today.  The ekg ordered today demonstrates sinus bradycardia rate is 54.  Normal P interval normal course complete duration morphology no ST segment changes  Recent Labs: No results found for requested labs within last 365 days.  Recent Lipid Panel No results found for: "CHOL", "TRIG", "HDL", "CHOLHDL", "VLDL", "LDLCALC", "LDLDIRECT"  Physical Exam:    VS:  BP (!) 176/86 (BP Location: Left Arm, Patient Position: Sitting)   Pulse (!) 56   Ht  6' (1.829 m)   Wt 226 lb 9.6 oz (102.8 kg)   SpO2 90%   BMI 30.73 kg/m     Wt Readings from Last 3 Encounters:  02/18/22 226 lb 9.6 oz (102.8 kg)  01/07/22 229 lb (103.9 kg)  04/12/20 231 lb 12.8 oz (105.1 kg)     GEN:  Well nourished, well developed in no acute distress HEENT: Normal NECK: No JVD; No carotid bruits LYMPHATICS: No lymphadenopathy CARDIAC: RRR, no murmurs, no rubs, no gallops RESPIRATORY:  Clear  to auscultation with poor air entry bilaterally ABDOMEN: Soft, non-tender, non-distended MUSCULOSKELETAL:  No edema; No deformity  SKIN: Warm and dry NEUROLOGIC:  Alert and oriented x 3 PSYCHIATRIC:  Normal affect   ASSESSMENT:    1. Atypical chest pain   2. Dyspnea on exertion   3. Other emphysema (HCC)   4. Diabetic ketoacidosis without coma associated with type 2 diabetes mellitus (HCC)   5. Mixed dyslipidemia    PLAN:    In order of problems listed above:  Atypical chest pain with dyspnea on exertion in this gentleman with multiple risk factors for coronary artery disease namely smoking dyslipidemia diabetes hypertension family history.  We had a long discussion with her today about the situation I think we deserve to check his heart make sure his symptomatology is not related to it.  I will schedule him to have echocardiogram to assess left ventricle ejection fraction.  Will pay special attention the right side function and size.  Will look also for pulmonary pressure.  As a part of evaluation we must rule out significant coronary artery disease.  That is namely because of his significant risk factors.  I think the best therapist to perform on him will be to have coronary CT angio which also will allow [place] to look at his pulmonary status.  I explained procedure to him including all risk benefits versus alternative.  In the meantime he is already on aspirin and statin which I will continue. Dyspnea exertion multifactorial clearly COPD and chronic smoking probably in the  role here however will rule out cardiac etiology of his symptomatology. Smoking obesity huge problem spent great of time talking about this I strongly recommend he quit hopefully he will be able to accomplish that. Dyslipidemia he is taking low intensity statin simvastatin 20 he is diabetic according to guidelines he need to be on moderate density statin I did review K PN data from June 28 of this year showed LDL of 84 HDL 41.  In the future we will discuss augmenting his medical therapy.  I will wait for results of his coronary CT angio to make a decision which way to go and how aggressive we need to be.   Medication Adjustments/Labs and Tests Ordered: Current medicines are reviewed at length with the patient today.  Concerns regarding medicines are outlined above.  No orders of the defined types were placed in this encounter.  No orders of the defined types were placed in this encounter.   Signed, Georgeanna Lea, MD, [facility]. 02/18/2022 9:09 AM    [facility]

## 2022-02-19 ENCOUNTER — Telehealth: Payer: Self-pay | Admitting: Physician Assistant

## 2022-02-19 NOTE — Telephone Encounter (Signed)
Covering preop today, this message was listed in preop Staff Messages. Hi Abbie! As in the other message I sent, once a provider has seen a patient for preop clearance and deemed them needing further testing, the preop APP team does not need to be involved. (Our role is to try and clear people who would not require office visits before hand. So if they're being seen by a provider, they do not need a second provider doing an additional clearance visit.) Please follow usual office protocol to arrange the test. Per office protocol, final clearance recommendations will need to be relayed to surgeon by the provider who saw patient (Dr. Bing Matter) or his nursing team. Thanks!

## 2022-02-20 ENCOUNTER — Telehealth: Payer: Self-pay | Admitting: Cardiology

## 2022-02-20 NOTE — Telephone Encounter (Signed)
Pt called stating he has a pharmacy change to   [facility]  [address], [place], [place] [address]  [phone]

## 2022-02-23 ENCOUNTER — Encounter: Payer: Self-pay | Admitting: Podiatry

## 2022-02-23 ENCOUNTER — Ambulatory Visit (INDEPENDENT_AMBULATORY_CARE_PROVIDER_SITE_OTHER): Payer: Medicare Other | Admitting: Podiatry

## 2022-02-23 DIAGNOSIS — E1110 Type 2 diabetes mellitus with ketoacidosis without coma: Secondary | ICD-10-CM | POA: Diagnosis not present

## 2022-02-23 DIAGNOSIS — M79675 Pain in left toe(s): Secondary | ICD-10-CM | POA: Diagnosis not present

## 2022-02-23 DIAGNOSIS — M79674 Pain in right toe(s): Secondary | ICD-10-CM | POA: Diagnosis not present

## 2022-02-23 DIAGNOSIS — B351 Tinea unguium: Secondary | ICD-10-CM

## 2022-02-23 NOTE — Progress Notes (Signed)
  Subjective:  Patient ID: Scott Figueroa, male    DOB: October 07, 1950,   MRN: [id number]  Chief Complaint  Patient presents with   Nail Problem    71 y.o. male presents for concern of thickened elongated and painful nails that are difficult to trim. Requesting to have them trimmed today. Relates burning and tingling in their feet. Patient is diabetic and last A1c was  Lab Results  Component Value Date   HGBA1C 12.0 (H) 01/09/2015   .   PCP:  Wilmer Floor., MD    . Denies any other pedal complaints. Denies n/v/f/c.   Past Medical History:  Diagnosis Date   ARF (acute renal failure) (HCC) 01/08/2015   Arthritis    Asthma    Cataract    COPD (chronic obstructive pulmonary disease) (HCC)    Diabetes mellitus without complication (HCC)    Gastroesophageal reflux disease without esophagitis    Gout    Heart disease    Hyperlipidemia    Hypertension    Mixed dyslipidemia 08/26/2016   Myocardial infarction [facility])    Osteoarthritis    Oxygen deficiency    uses Oxygen PRN   Type 2 diabetes mellitus with ketoacidosis without coma (HCC)    Vitamin D deficiency 08/26/2016    Objective:  Physical Exam: Vascular: DP/PT pulses 2/4 bilateral. CFT <3 seconds. Absent hair growth on digits. Edema noted to bilateral lower extremities. Xerosis noted bilaterally.  Skin. No lacerations or abrasions bilateral feet. Nails 1-5 bilateral  are thickened discolored and elongated with subungual debris.  Musculoskeletal: MMT 5/5 bilateral lower extremities in DF, PF, Inversion and Eversion. Deceased ROM in DF of ankle joint.  Neurological: Sensation intact to light touch. Protective sensation diminished bilateral.     Assessment:   1. Pain due to onychomycosis of toenails of both feet   2. Type 2 diabetes mellitus with ketoacidosis without coma, unspecified whether long term insulin use (HCC)      Plan:  Patient was evaluated and treated and all questions answered. -Discussed and educated  patient on diabetic foot care, especially with  regards to the vascular, neurological and musculoskeletal systems.  -Stressed the importance of good glycemic control and the detriment of not  controlling glucose levels in relation to the foot. -Discussed supportive shoes at all times and checking feet regularly.  -Mechanically debrided all nails 1-5 bilateral using sterile nail nipper and filed with dremel without incident  -Answered all patient questions -Patient to return  in 3 months for at risk foot care -Patient advised to call the office if any problems or questions arise in the meantime.   Louann Sjogren, DPM

## 2022-02-24 ENCOUNTER — Ambulatory Visit (INDEPENDENT_AMBULATORY_CARE_PROVIDER_SITE_OTHER): Payer: Medicare Other

## 2022-02-24 DIAGNOSIS — E782 Mixed hyperlipidemia: Secondary | ICD-10-CM

## 2022-02-24 DIAGNOSIS — R0789 Other chest pain: Secondary | ICD-10-CM | POA: Diagnosis not present

## 2022-02-24 DIAGNOSIS — E1110 Type 2 diabetes mellitus with ketoacidosis without coma: Secondary | ICD-10-CM | POA: Diagnosis not present

## 2022-02-24 DIAGNOSIS — R0609 Other forms of dyspnea: Secondary | ICD-10-CM

## 2022-02-24 DIAGNOSIS — J438 Other emphysema: Secondary | ICD-10-CM | POA: Diagnosis not present

## 2022-02-24 DIAGNOSIS — R072 Precordial pain: Secondary | ICD-10-CM | POA: Diagnosis not present

## 2022-02-24 LAB — ECHOCARDIOGRAM COMPLETE
Area-P 1/2: 4.24 cm2
P 1/2 time: 437 msec
S' Lateral: 3.4 cm

## 2022-02-26 ENCOUNTER — Telehealth: Payer: Self-pay

## 2022-02-26 NOTE — Telephone Encounter (Signed)
Results reviewed with pt as per Dr. Krasowski's note.  Pt verbalized understanding and had no additional questions. Routed to PCP  

## 2022-02-27 DIAGNOSIS — J449 Chronic obstructive pulmonary disease, unspecified: Secondary | ICD-10-CM | POA: Diagnosis not present

## 2022-03-02 ENCOUNTER — Telehealth: Payer: Self-pay

## 2022-03-02 NOTE — Telephone Encounter (Signed)
-----   Message from Georgeanna Lea, MD sent at 02/26/2022  8:45 PM EDT ----- Echocardiogram looks good, mild mitral valve regurgitation continue present management

## 2022-03-02 NOTE — Telephone Encounter (Signed)
Patient notified of results.

## 2022-03-05 DIAGNOSIS — J449 Chronic obstructive pulmonary disease, unspecified: Secondary | ICD-10-CM | POA: Diagnosis not present

## 2022-03-05 DIAGNOSIS — F17210 Nicotine dependence, cigarettes, uncomplicated: Secondary | ICD-10-CM | POA: Diagnosis not present

## 2022-03-05 DIAGNOSIS — G4733 Obstructive sleep apnea (adult) (pediatric): Secondary | ICD-10-CM | POA: Diagnosis not present

## 2022-03-09 ENCOUNTER — Telehealth (HOSPITAL_COMMUNITY): Payer: Self-pay | Admitting: *Deleted

## 2022-03-09 NOTE — Telephone Encounter (Signed)
Reaching out to patient to offer assistance regarding upcoming cardiac imaging study; pt verbalizes understanding of appt date/time, parking situation and where to check in, pre-test NPO status and verified current allergies; name and call back number provided for further questions should they arise  [name] RN Navigator Cardiac Imaging [facility] [phone] office [phone] cell  Patient aware to arrive at 1:30pm for his cardiac CT scan.

## 2022-03-10 ENCOUNTER — Ambulatory Visit (HOSPITAL_COMMUNITY)
Admission: RE | Admit: 2022-03-10 | Discharge: 2022-03-10 | Disposition: A | Discharge status: Home / Self Care | Payer: Medicare Other | Source: Ambulatory Visit | Attending: Cardiology | Admitting: Cardiology

## 2022-03-10 DIAGNOSIS — I2510 Atherosclerotic heart disease of native coronary artery without angina pectoris: Secondary | ICD-10-CM | POA: Diagnosis not present

## 2022-03-10 DIAGNOSIS — R931 Abnormal findings on diagnostic imaging of heart and coronary circulation: Secondary | ICD-10-CM | POA: Diagnosis not present

## 2022-03-10 DIAGNOSIS — R072 Precordial pain: Secondary | ICD-10-CM | POA: Diagnosis not present

## 2022-03-10 MED ORDER — NITROGLYCERIN 0.4 MG SL SUBL
SUBLINGUAL_TABLET | SUBLINGUAL | Status: AC
  Filled 2022-03-10: qty 2

## 2022-03-10 MED ORDER — IOHEXOL 350 MG/ML SOLN
100.0000 mL | Freq: Once | INTRAVENOUS | Status: AC | PRN
  Administered 2022-03-10: 100 mL via INTRAVENOUS

## 2022-03-10 MED ORDER — NITROGLYCERIN 0.4 MG SL SUBL
0.8000 mg | SUBLINGUAL_TABLET | Freq: Once | SUBLINGUAL | Status: AC
  Administered 2022-03-10: 0.8 mg via SUBLINGUAL

## 2022-03-11 ENCOUNTER — Ambulatory Visit (HOSPITAL_BASED_OUTPATIENT_CLINIC_OR_DEPARTMENT_OTHER)
Admission: RE | Admit: 2022-03-11 | Discharge: 2022-03-11 | Disposition: A | Discharge status: Home / Self Care | Payer: Medicare Other | Source: Ambulatory Visit | Attending: Cardiology | Admitting: Cardiology

## 2022-03-11 ENCOUNTER — Other Ambulatory Visit: Payer: Self-pay | Admitting: Cardiology

## 2022-03-11 ENCOUNTER — Ambulatory Visit (HOSPITAL_COMMUNITY)
Admission: RE | Admit: 2022-03-11 | Discharge: 2022-03-11 | Disposition: A | Discharge status: Home / Self Care | Payer: Medicare Other | Source: Ambulatory Visit | Attending: Cardiology | Admitting: Cardiology

## 2022-03-11 DIAGNOSIS — Z0000 Encounter for general adult medical examination without abnormal findings: Secondary | ICD-10-CM | POA: Diagnosis not present

## 2022-03-11 DIAGNOSIS — R072 Precordial pain: Secondary | ICD-10-CM | POA: Diagnosis not present

## 2022-03-11 DIAGNOSIS — Z9181 History of falling: Secondary | ICD-10-CM | POA: Diagnosis not present

## 2022-03-11 DIAGNOSIS — R931 Abnormal findings on diagnostic imaging of heart and coronary circulation: Secondary | ICD-10-CM

## 2022-03-11 DIAGNOSIS — E785 Hyperlipidemia, unspecified: Secondary | ICD-10-CM | POA: Diagnosis not present

## 2022-03-11 NOTE — Progress Notes (Signed)
FFR order 

## 2022-03-17 DIAGNOSIS — F17210 Nicotine dependence, cigarettes, uncomplicated: Secondary | ICD-10-CM | POA: Diagnosis not present

## 2022-03-17 DIAGNOSIS — Z87891 Personal history of nicotine dependence: Secondary | ICD-10-CM | POA: Diagnosis not present

## 2022-03-17 DIAGNOSIS — Z122 Encounter for screening for malignant neoplasm of respiratory organs: Secondary | ICD-10-CM | POA: Diagnosis not present

## 2022-03-19 DIAGNOSIS — G4733 Obstructive sleep apnea (adult) (pediatric): Secondary | ICD-10-CM | POA: Diagnosis not present

## 2022-03-19 DIAGNOSIS — R918 Other nonspecific abnormal finding of lung field: Secondary | ICD-10-CM | POA: Diagnosis not present

## 2022-03-19 DIAGNOSIS — J449 Chronic obstructive pulmonary disease, unspecified: Secondary | ICD-10-CM | POA: Diagnosis not present

## 2022-03-19 DIAGNOSIS — F17210 Nicotine dependence, cigarettes, uncomplicated: Secondary | ICD-10-CM | POA: Diagnosis not present

## 2022-03-30 DIAGNOSIS — J449 Chronic obstructive pulmonary disease, unspecified: Secondary | ICD-10-CM | POA: Diagnosis not present

## 2022-03-31 ENCOUNTER — Telehealth: Payer: Self-pay

## 2022-03-31 NOTE — Telephone Encounter (Signed)
Results reviewed with pt as per Dr. Krasowski's note.  Pt verbalized understanding and had no additional questions. Routed to PCP  

## 2022-04-29 DIAGNOSIS — J449 Chronic obstructive pulmonary disease, unspecified: Secondary | ICD-10-CM | POA: Diagnosis not present

## 2022-04-30 ENCOUNTER — Ambulatory Visit: Payer: Medicare Other | Attending: Cardiology | Admitting: Cardiology

## 2022-04-30 ENCOUNTER — Encounter: Payer: Self-pay | Admitting: Cardiology

## 2022-04-30 VITALS — BP 140/60 | HR 71 | Ht 72.0 in | Wt 223.4 lb

## 2022-04-30 DIAGNOSIS — F17200 Nicotine dependence, unspecified, uncomplicated: Secondary | ICD-10-CM

## 2022-04-30 DIAGNOSIS — E1110 Type 2 diabetes mellitus with ketoacidosis without coma: Secondary | ICD-10-CM

## 2022-04-30 DIAGNOSIS — I10 Essential (primary) hypertension: Secondary | ICD-10-CM

## 2022-04-30 DIAGNOSIS — I2510 Atherosclerotic heart disease of native coronary artery without angina pectoris: Secondary | ICD-10-CM | POA: Diagnosis not present

## 2022-04-30 DIAGNOSIS — E785 Hyperlipidemia, unspecified: Secondary | ICD-10-CM | POA: Diagnosis not present

## 2022-04-30 MED ORDER — ATORVASTATIN CALCIUM 40 MG PO TABS: 40.0000 mg | ORAL_TABLET | Freq: Every day | ORAL | 3 refills | Status: DC

## 2022-04-30 MED ORDER — METOPROLOL SUCCINATE ER 50 MG PO TB24: 50.0000 mg | ORAL_TABLET | Freq: Every day | ORAL | 3 refills | Status: DC

## 2022-04-30 NOTE — Patient Instructions (Signed)
Medication Instructions:  Your physician has recommended you make the following change in your medication: START: Metoprolol Succinate 50mg  1 tablet daily STOP: Simvastatin START: Lipitor 40mg  1 tablet daily    Lab Work: Your physician recommends that you return for lab work in: 6 weeks You need to have labs done when you are fasting.  You can come Monday through Friday 8:30 am to 12:00 pm and 1:15 to 4:30. You do not need to make an appointment as the order has already been placed. The labs you are going to have done are AST, ALT, Lipids.    Testing/Procedures: None Ordered   Follow-Up: At [facility], you and your health needs are our priority.  As part of our continuing mission to provide you with exceptional heart care, we have created designated Provider Care Teams.  These Care Teams include your primary Cardiologist (physician) and Advanced Practice Providers (APPs -  Physician Assistants and Nurse Practitioners) who all work together to provide you with the care you need, when you need it.  We recommend signing up for the patient portal called "MyChart".  Sign up information is provided on this After Visit Summary.  MyChart is used to connect with patients for Virtual Visits (Telemedicine).  Patients are able to view lab/test results, encounter notes, upcoming appointments, etc.  Non-urgent messages can be sent to your provider as well.   To learn more about what you can do with MyChart, go to [email or web address].    Your next appointment:   6 month(s)  The format for your next appointment:   In Person  Provider:   Jenne Campus, MD    Other Instructions NA

## 2022-04-30 NOTE — Progress Notes (Signed)
Cardiology Office Note:    Date:  04/30/2022   ID:  Scott Figueroa, DOB [birth year], MRN [id number]  PCP:  Wilmer Floor., MD  Cardiologist:  Gypsy Balsam, MD    Referring MD: Wilmer Floor., MD   Chief Complaint  Patient presents with   Follow-up  Doing well but comes here to discuss results of the test  History of Present Illness:    Scott Figueroa is a 71 y.o. male with past medical history significant for diabetes, essential hypertension, dyslipidemia, smoking which is still going, family history of coronary artery disease but not premature.  He is in my office to talk about results of his coronary CT angio.  Coronary CT angio showed hemodynamically significant stenosis in the distal portion of the obtuse marginal branch.  Overall small vessel, his lung portion of the scan showed some pulmonary nodules that need to be followed.  He is coming today to my office to talk about options.  He denies have any chest pain tightness squeezing pressure burning chest still complain of having some exertional shortness of breath  Past Medical History:  Diagnosis Date   ARF (acute renal failure) (HCC) 01/08/2015   Arthritis    Asthma    Cataract    COPD (chronic obstructive pulmonary disease) (HCC)    Diabetes mellitus without complication (HCC)    Gastroesophageal reflux disease without esophagitis    Gout    Heart disease    Hyperlipidemia    Hypertension    Mixed dyslipidemia 08/26/2016   Myocardial infarction [facility])    Osteoarthritis    Oxygen deficiency    uses Oxygen PRN   Type 2 diabetes mellitus with ketoacidosis without coma (HCC)    Vitamin D deficiency 08/26/2016    Past Surgical History:  Procedure Laterality Date   COLONOSCOPY  02/17/2013   Colon polyp-status post polpectomy. Small internal hemorrhoids. Otherwise grossly normal colonoscopy to cecum   left arm surgery     left knee surgery      Current Medications: Current Meds  Medication Sig    albuterol (PROAIR HFA) 108 (90 Base) MCG/ACT inhaler Inhale 2 puffs into the lungs every 6 (six) hours as needed for wheezing or shortness of breath.   allopurinol (ZYLOPRIM) 100 MG tablet Take 100 mg by mouth daily.   amLODipine (NORVASC) 5 MG tablet Take 1 tablet (5 mg total) by mouth daily.   aspirin 81 MG tablet Take 1 tablet (81 mg total) by mouth daily.   benazepril-hydrochlorthiazide (LOTENSIN HCT) 20-25 MG per tablet Take 1 tablet by mouth daily.   buPROPion (WELLBUTRIN SR) 150 MG 12 hr tablet Take 150 mg by mouth daily.   cholecalciferol (VITAMIN D3) 25 MCG (1000 UNIT) tablet Take 1,000 Units by mouth daily.   colchicine 0.6 MG tablet Take 0.6 mg by mouth 2 (two) times daily.   esomeprazole (NEXIUM) 40 MG capsule Take 40 mg by mouth daily at 12 noon.   fluticasone-salmeterol (ADVAIR DISKUS) 500-50 MCG/ACT AEPB Inhale 1 puff into the lungs in the morning and at bedtime.   insulin glargine (LANTUS) 100 UNIT/ML injection Inject 20 Units into the skin daily.   meloxicam (MOBIC) 15 MG tablet Take 15 mg by mouth daily as needed for pain.   metFORMIN (GLUCOPHAGE) 500 MG tablet Take 500 mg by mouth 2 (two) times daily with a meal.   metoprolol tartrate (LOPRESSOR) 25 MG tablet Take 1 tablet (25 mg total) by mouth as directed. Take 2 hours before CT  nabumetone (RELAFEN) 750 MG tablet Take 750 mg by mouth daily.   omeprazole (PRILOSEC) 40 MG capsule Take 40 mg by mouth daily.   polyethylene glycol (MIRALAX / GLYCOLAX) 17 g packet Take 17 g by mouth daily as needed for mild constipation or moderate constipation.   simvastatin (ZOCOR) 20 MG tablet Take 20 mg by mouth daily.   tamsulosin (FLOMAX) 0.4 MG CAPS capsule Take 0.4 mg by mouth at bedtime.     Allergies:   Patient has no known allergies.   Social History   Socioeconomic History   Marital status: Married    Spouse name: Not on file   Number of children: Not on file   Years of education: Not on file   Highest education level: Not  on file  Occupational History   Not on file  Tobacco Use   Smoking status: Every Day    Packs/day: 0.50    Types: Cigarettes   Smokeless tobacco: Former  Scientific laboratory technician Use: Former  Substance and Sexual Activity   Alcohol use: Yes    Comment: Occasional   Drug use: No   Sexual activity: Not on file  Other Topics Concern   Not on file  Social History Narrative   Not on file   Social Determinants of Health   Financial Resource Strain: Not on file  Food Insecurity: Not on file  Transportation Needs: Not on file  Physical Activity: Not on file  Stress: Not on file  Social Connections: Not on file     Family History: The patient's family history includes Colon cancer in his sister; Diabetes in his father and mother; Diabetes Mellitus II in his mother; Heart failure in his father; Hypertension in his father and mother. There is no history of Esophageal cancer, Stomach cancer, or Rectal cancer. ROS:   Please see the history of present illness.    All 14 point review of systems negative except as described per history of present illness  EKGs/Labs/Other Studies Reviewed:      Recent Labs: 02/18/2022: BUN 8; Creatinine, Ser 0.79; Potassium 4.4; Sodium 140  Recent Lipid Panel No results found for: "CHOL", "TRIG", "HDL", "CHOLHDL", "VLDL", "LDLCALC", "LDLDIRECT"  Physical Exam:    VS:  BP (!) 140/60 (BP Location: Left Arm, Patient Position: Sitting)   Pulse 71   Ht 6' (1.829 m)   Wt 223 lb 6.4 oz (101.3 kg)   SpO2 94%   BMI 30.30 kg/m     Wt Readings from Last 3 Encounters:  04/30/22 223 lb 6.4 oz (101.3 kg)  02/18/22 226 lb 9.6 oz (102.8 kg)  01/07/22 229 lb (103.9 kg)     GEN:  Well nourished, well developed in no acute distress HEENT: Normal NECK: No JVD; No carotid bruits LYMPHATICS: No lymphadenopathy CARDIAC: RRR, no murmurs, no rubs, no gallops RESPIRATORY:  Clear to auscultation without rales, wheezing or rhonchi  ABDOMEN: Soft, non-tender,  non-distended MUSCULOSKELETAL:  No edema; No deformity  SKIN: Warm and dry LOWER EXTREMITIES: no swelling NEUROLOGIC:  Alert and oriented x 3 PSYCHIATRIC:  Normal affect   ASSESSMENT:    1. Primary hypertension   2. Coronary artery disease involving native coronary artery of native heart without angina pectoris   3. Type 2 diabetes mellitus with ketoacidosis without coma, unspecified whether long term insulin use (HCC)   4. Smoking   5. Dyslipidemia    PLAN:    In order of problems listed above:  Coronary disease.  He does  have distal disease of the obtuse marginal branch.  We had a long discussion about what to do with the situation probably best approach is to do medical therapy.  Therefore, I will continue antiplatelets therapy, I will intensify his lipid management so I will discontinue his simvastatin and put him on Lipitor 40 mg daily, I will also add small dose of beta-blockers.  I will put him on Toprol succinate 50 mg daily, he took that medication before his coronary CT angio have no difficulty tolerating it. Essential hypertension: Blood pressure slightly on the higher side, will add beta-blocker which should help. Dyslipidemia we will switch him from simvastatin to atorvastatin which is high intense statin Smoking: Obesity huge problem he was strongly advised to quit.   Medication Adjustments/Labs and Tests Ordered: Current medicines are reviewed at length with the patient today.  Concerns regarding medicines are outlined above.  No orders of the defined types were placed in this encounter.  Medication changes: No orders of the defined types were placed in this encounter.   Signed, Park Liter, MD, [facility] 04/30/2022 3:14 PM    [place]

## 2022-05-21 DIAGNOSIS — Z23 Encounter for immunization: Secondary | ICD-10-CM | POA: Diagnosis not present

## 2022-05-21 DIAGNOSIS — E1165 Type 2 diabetes mellitus with hyperglycemia: Secondary | ICD-10-CM | POA: Diagnosis not present

## 2022-05-21 DIAGNOSIS — J449 Chronic obstructive pulmonary disease, unspecified: Secondary | ICD-10-CM | POA: Diagnosis not present

## 2022-05-21 DIAGNOSIS — I38 Endocarditis, valve unspecified: Secondary | ICD-10-CM | POA: Diagnosis not present

## 2022-05-21 DIAGNOSIS — K219 Gastro-esophageal reflux disease without esophagitis: Secondary | ICD-10-CM | POA: Diagnosis not present

## 2022-05-21 DIAGNOSIS — Z139 Encounter for screening, unspecified: Secondary | ICD-10-CM | POA: Diagnosis not present

## 2022-05-21 DIAGNOSIS — I10 Essential (primary) hypertension: Secondary | ICD-10-CM | POA: Diagnosis not present

## 2022-05-29 ENCOUNTER — Ambulatory Visit: Payer: Medicare Other | Admitting: Podiatry

## 2022-05-30 DIAGNOSIS — J449 Chronic obstructive pulmonary disease, unspecified: Secondary | ICD-10-CM | POA: Diagnosis not present

## 2022-06-29 DIAGNOSIS — J449 Chronic obstructive pulmonary disease, unspecified: Secondary | ICD-10-CM | POA: Diagnosis not present

## 2022-07-15 ENCOUNTER — Ambulatory Visit (INDEPENDENT_AMBULATORY_CARE_PROVIDER_SITE_OTHER): Payer: Medicare Other | Admitting: Podiatry

## 2022-07-15 ENCOUNTER — Encounter: Payer: Self-pay | Admitting: Podiatry

## 2022-07-15 DIAGNOSIS — B351 Tinea unguium: Secondary | ICD-10-CM

## 2022-07-15 DIAGNOSIS — M79675 Pain in left toe(s): Secondary | ICD-10-CM | POA: Diagnosis not present

## 2022-07-15 DIAGNOSIS — E1110 Type 2 diabetes mellitus with ketoacidosis without coma: Secondary | ICD-10-CM | POA: Diagnosis not present

## 2022-07-15 DIAGNOSIS — M79674 Pain in right toe(s): Secondary | ICD-10-CM | POA: Diagnosis not present

## 2022-07-15 NOTE — Progress Notes (Signed)
  Subjective:  Patient ID: Scott Figueroa, male    DOB: 04/25/1951,   MRN: [id number]  Chief Complaint  Patient presents with   Nail Problem    Diabetic Foot Care- Nail trim     72 y.o. male presents for concern of thickened elongated and painful nails that are difficult to trim. Requesting to have them trimmed today. Relates burning and tingling in their feet. Patient is diabetic and last A1c was  Lab Results  Component Value Date   HGBA1C 12.0 (H) 01/09/2015   .   PCP:  Helen Hashimoto., MD    . Denies any other pedal complaints. Denies n/v/f/c.   Past Medical History:  Diagnosis Date   ARF (acute renal failure) ([place]) 01/08/2015   Arthritis    Asthma    Cataract    COPD (chronic obstructive pulmonary disease) ([place])    Diabetes mellitus without complication ([place])    Gastroesophageal reflux disease without esophagitis    Gout    Heart disease    Hyperlipidemia    Hypertension    Mixed dyslipidemia 08/26/2016   Myocardial infarction [facility])    Osteoarthritis    Oxygen deficiency    uses Oxygen PRN   Type 2 diabetes mellitus with ketoacidosis without coma ([place])    Vitamin D deficiency 08/26/2016    Objective:  Physical Exam: Vascular: DP/PT pulses 2/4 bilateral. CFT <3 seconds. Absent hair growth on digits. Edema noted to bilateral lower extremities. Xerosis noted bilaterally.  Skin. No lacerations or abrasions bilateral feet. Nails 1-5 bilateral  are thickened discolored and elongated with subungual debris.  Musculoskeletal: MMT 5/5 bilateral lower extremities in DF, PF, Inversion and Eversion. Deceased ROM in DF of ankle joint.  Neurological: Sensation intact to light touch. Protective sensation diminished bilateral.     Assessment:   1. Pain due to onychomycosis of toenails of both feet   2. Type 2 diabetes mellitus with ketoacidosis without coma, unspecified whether long term insulin use ([place])       Plan:  Patient was evaluated and treated and all questions  answered. -Discussed and educated patient on diabetic foot care, especially with  regards to the vascular, neurological and musculoskeletal systems.  -Stressed the importance of good glycemic control and the detriment of not  controlling glucose levels in relation to the foot. -Discussed supportive shoes at all times and checking feet regularly.  -Mechanically debrided all nails 1-5 bilateral using sterile nail nipper and filed with dremel without incident  -Answered all patient questions -Patient to return  in 3 months for at risk foot care -Patient advised to call the office if any problems or questions arise in the meantime.   Lorenda Peck, DPM

## 2022-07-30 DIAGNOSIS — J449 Chronic obstructive pulmonary disease, unspecified: Secondary | ICD-10-CM | POA: Diagnosis not present

## 2022-08-12 DIAGNOSIS — C3490 Malignant neoplasm of unspecified part of unspecified bronchus or lung: Secondary | ICD-10-CM | POA: Diagnosis not present

## 2022-08-12 DIAGNOSIS — J479 Bronchiectasis, uncomplicated: Secondary | ICD-10-CM | POA: Diagnosis not present

## 2022-08-12 DIAGNOSIS — J439 Emphysema, unspecified: Secondary | ICD-10-CM | POA: Diagnosis not present

## 2022-08-12 DIAGNOSIS — R918 Other nonspecific abnormal finding of lung field: Secondary | ICD-10-CM | POA: Diagnosis not present

## 2022-08-20 DIAGNOSIS — J449 Chronic obstructive pulmonary disease, unspecified: Secondary | ICD-10-CM | POA: Diagnosis not present

## 2022-08-20 DIAGNOSIS — J8410 Pulmonary fibrosis, unspecified: Secondary | ICD-10-CM | POA: Diagnosis not present

## 2022-08-20 DIAGNOSIS — G4733 Obstructive sleep apnea (adult) (pediatric): Secondary | ICD-10-CM | POA: Diagnosis not present

## 2022-08-20 DIAGNOSIS — F17210 Nicotine dependence, cigarettes, uncomplicated: Secondary | ICD-10-CM | POA: Diagnosis not present

## 2022-08-20 DIAGNOSIS — R918 Other nonspecific abnormal finding of lung field: Secondary | ICD-10-CM | POA: Diagnosis not present

## 2022-08-27 DIAGNOSIS — G4733 Obstructive sleep apnea (adult) (pediatric): Secondary | ICD-10-CM | POA: Diagnosis not present

## 2022-08-30 DIAGNOSIS — J449 Chronic obstructive pulmonary disease, unspecified: Secondary | ICD-10-CM | POA: Diagnosis not present

## 2022-09-03 ENCOUNTER — Telehealth: Payer: Self-pay

## 2022-09-03 DIAGNOSIS — J449 Chronic obstructive pulmonary disease, unspecified: Secondary | ICD-10-CM | POA: Diagnosis not present

## 2022-09-03 DIAGNOSIS — F17210 Nicotine dependence, cigarettes, uncomplicated: Secondary | ICD-10-CM | POA: Diagnosis not present

## 2022-09-03 DIAGNOSIS — G4733 Obstructive sleep apnea (adult) (pediatric): Secondary | ICD-10-CM | POA: Diagnosis not present

## 2022-09-03 DIAGNOSIS — R918 Other nonspecific abnormal finding of lung field: Secondary | ICD-10-CM | POA: Diagnosis not present

## 2022-09-03 DIAGNOSIS — J8410 Pulmonary fibrosis, unspecified: Secondary | ICD-10-CM | POA: Diagnosis not present

## 2022-09-03 NOTE — Patient Outreach (Signed)
  Care Coordination   Initial Visit Note   09/03/2022 Name: Scott Figueroa MRN: [id number] DOB: Dec 13, 1950  Scott Figueroa is a 72 y.o. year old male who sees Helen Hashimoto., MD for primary care. I spoke with  Scott Figueroa by phone today.  What matters to the patients health and wellness today?  Placed call to patient today to review and offer [facility] care coordination program. Patient reports that he is doing well and denies any current needs.     SDOH assessments and interventions completed:  No     Care Coordination Interventions:  No, not indicated   Follow up plan: No further intervention required.   Encounter Outcome:  Pt. Refused   Tomasa Rand, RN, BSN, CEN [facility] [employer] [phone]

## 2022-10-14 ENCOUNTER — Ambulatory Visit (INDEPENDENT_AMBULATORY_CARE_PROVIDER_SITE_OTHER): Payer: 59 | Admitting: Podiatry

## 2022-10-14 ENCOUNTER — Encounter: Payer: Self-pay | Admitting: Podiatry

## 2022-10-14 DIAGNOSIS — M79674 Pain in right toe(s): Secondary | ICD-10-CM

## 2022-10-14 DIAGNOSIS — M79675 Pain in left toe(s): Secondary | ICD-10-CM

## 2022-10-14 DIAGNOSIS — B351 Tinea unguium: Secondary | ICD-10-CM | POA: Diagnosis not present

## 2022-10-14 DIAGNOSIS — E1110 Type 2 diabetes mellitus with ketoacidosis without coma: Secondary | ICD-10-CM | POA: Diagnosis not present

## 2022-10-14 NOTE — Progress Notes (Signed)
  Subjective:  Patient ID: Scott Figueroa, male    DOB: 20-Apr-1951,   MRN: [id number]  Chief Complaint  Patient presents with   routine foot care     72 y.o. male presents for concern of thickened elongated and painful nails that are difficult to trim. Requesting to have them trimmed today. Relates burning and tingling in their feet. Patient is diabetic and last A1c was  Lab Results  Component Value Date   HGBA1C 12.0 (H) 01/09/2015   .   PCP:  Helen Hashimoto., MD    . Denies any other pedal complaints. Denies n/v/f/c.   Past Medical History:  Diagnosis Date   ARF (acute renal failure) ([place]) 01/08/2015   Arthritis    Asthma    Cataract    COPD (chronic obstructive pulmonary disease) ([place])    Diabetes mellitus without complication ([place])    Gastroesophageal reflux disease without esophagitis    Gout    Heart disease    Hyperlipidemia    Hypertension    Mixed dyslipidemia 08/26/2016   Myocardial infarction [facility])    Osteoarthritis    Oxygen deficiency    uses Oxygen PRN   Type 2 diabetes mellitus with ketoacidosis without coma ([place])    Vitamin D deficiency 08/26/2016    Objective:  Physical Exam: Vascular: DP/PT pulses 2/4 bilateral. CFT <3 seconds. Absent hair growth on digits. Edema noted to bilateral lower extremities. Xerosis noted bilaterally.  Skin. No lacerations or abrasions bilateral feet. Nails 1-5 bilateral  are thickened discolored and elongated with subungual debris.  Musculoskeletal: MMT 5/5 bilateral lower extremities in DF, PF, Inversion and Eversion. Deceased ROM in DF of ankle joint.  Neurological: Sensation intact to light touch. Protective sensation diminished bilateral.     Assessment:   1. Pain due to onychomycosis of toenails of both feet   2. Type 2 diabetes mellitus with ketoacidosis without coma, unspecified whether long term insulin use       Plan:  Patient was evaluated and treated and all questions answered. -Discussed and educated  patient on diabetic foot care, especially with  regards to the vascular, neurological and musculoskeletal systems.  -Stressed the importance of good glycemic control and the detriment of not  controlling glucose levels in relation to the foot. -Discussed supportive shoes at all times and checking feet regularly.  -Mechanically debrided all nails 1-5 bilateral using sterile nail nipper and filed with dremel without incident  -Answered all patient questions -Patient to return  in 3 months for at risk foot care -Patient advised to call the office if any problems or questions arise in the meantime.   Lorenda Peck, DPM

## 2022-10-23 ENCOUNTER — Other Ambulatory Visit: Payer: Self-pay

## 2022-10-23 DIAGNOSIS — J449 Chronic obstructive pulmonary disease, unspecified: Secondary | ICD-10-CM | POA: Diagnosis not present

## 2022-10-23 DIAGNOSIS — J479 Bronchiectasis, uncomplicated: Secondary | ICD-10-CM | POA: Diagnosis not present

## 2022-10-23 DIAGNOSIS — J8410 Pulmonary fibrosis, unspecified: Secondary | ICD-10-CM | POA: Diagnosis not present

## 2022-10-23 DIAGNOSIS — G4733 Obstructive sleep apnea (adult) (pediatric): Secondary | ICD-10-CM | POA: Diagnosis not present

## 2022-10-23 MED ORDER — METOPROLOL SUCCINATE ER 50 MG PO TB24: 50.0000 mg | ORAL_TABLET | Freq: Every day | ORAL | 2 refills | Status: DC

## 2022-10-23 MED ORDER — ATORVASTATIN CALCIUM 40 MG PO TABS: 40.0000 mg | ORAL_TABLET | Freq: Every day | ORAL | 2 refills | Status: DC

## 2022-10-26 DIAGNOSIS — J449 Chronic obstructive pulmonary disease, unspecified: Secondary | ICD-10-CM | POA: Diagnosis not present

## 2022-10-29 DIAGNOSIS — J449 Chronic obstructive pulmonary disease, unspecified: Secondary | ICD-10-CM | POA: Diagnosis not present

## 2022-11-22 DIAGNOSIS — G4733 Obstructive sleep apnea (adult) (pediatric): Secondary | ICD-10-CM | POA: Diagnosis not present

## 2022-11-25 DIAGNOSIS — J449 Chronic obstructive pulmonary disease, unspecified: Secondary | ICD-10-CM | POA: Diagnosis not present

## 2022-11-28 DIAGNOSIS — J449 Chronic obstructive pulmonary disease, unspecified: Secondary | ICD-10-CM | POA: Diagnosis not present

## 2022-12-10 DIAGNOSIS — H25813 Combined forms of age-related cataract, bilateral: Secondary | ICD-10-CM | POA: Diagnosis not present

## 2022-12-23 DIAGNOSIS — G4733 Obstructive sleep apnea (adult) (pediatric): Secondary | ICD-10-CM | POA: Diagnosis not present

## 2022-12-26 DIAGNOSIS — J449 Chronic obstructive pulmonary disease, unspecified: Secondary | ICD-10-CM | POA: Diagnosis not present

## 2022-12-29 DIAGNOSIS — J449 Chronic obstructive pulmonary disease, unspecified: Secondary | ICD-10-CM | POA: Diagnosis not present

## 2023-01-06 ENCOUNTER — Encounter (INDEPENDENT_AMBULATORY_CARE_PROVIDER_SITE_OTHER): Payer: 59 | Admitting: Podiatry

## 2023-01-06 DIAGNOSIS — Z91199 Patient's noncompliance with other medical treatment and regimen due to unspecified reason: Secondary | ICD-10-CM

## 2023-01-09 NOTE — Progress Notes (Signed)
Patient was a no-show for his scheduled appointment today.

## 2023-01-19 DIAGNOSIS — E1165 Type 2 diabetes mellitus with hyperglycemia: Secondary | ICD-10-CM | POA: Diagnosis not present

## 2023-01-19 DIAGNOSIS — J45901 Unspecified asthma with (acute) exacerbation: Secondary | ICD-10-CM | POA: Diagnosis not present

## 2023-01-19 DIAGNOSIS — I38 Endocarditis, valve unspecified: Secondary | ICD-10-CM | POA: Diagnosis not present

## 2023-01-19 DIAGNOSIS — D696 Thrombocytopenia, unspecified: Secondary | ICD-10-CM | POA: Diagnosis not present

## 2023-01-19 DIAGNOSIS — M47815 Spondylosis without myelopathy or radiculopathy, thoracolumbar region: Secondary | ICD-10-CM | POA: Diagnosis not present

## 2023-01-19 DIAGNOSIS — I10 Essential (primary) hypertension: Secondary | ICD-10-CM | POA: Diagnosis not present

## 2023-01-25 DIAGNOSIS — J449 Chronic obstructive pulmonary disease, unspecified: Secondary | ICD-10-CM | POA: Diagnosis not present

## 2023-01-28 DIAGNOSIS — J449 Chronic obstructive pulmonary disease, unspecified: Secondary | ICD-10-CM | POA: Diagnosis not present

## 2023-02-04 DIAGNOSIS — J929 Pleural plaque without asbestos: Secondary | ICD-10-CM | POA: Diagnosis not present

## 2023-02-04 DIAGNOSIS — R918 Other nonspecific abnormal finding of lung field: Secondary | ICD-10-CM | POA: Diagnosis not present

## 2023-02-04 DIAGNOSIS — M7989 Other specified soft tissue disorders: Secondary | ICD-10-CM | POA: Diagnosis not present

## 2023-02-04 DIAGNOSIS — R609 Edema, unspecified: Secondary | ICD-10-CM | POA: Diagnosis not present

## 2023-02-05 DIAGNOSIS — R609 Edema, unspecified: Secondary | ICD-10-CM | POA: Diagnosis not present

## 2023-02-25 DIAGNOSIS — J449 Chronic obstructive pulmonary disease, unspecified: Secondary | ICD-10-CM | POA: Diagnosis not present

## 2023-02-28 DIAGNOSIS — J449 Chronic obstructive pulmonary disease, unspecified: Secondary | ICD-10-CM | POA: Diagnosis not present

## 2023-03-16 DIAGNOSIS — J479 Bronchiectasis, uncomplicated: Secondary | ICD-10-CM | POA: Diagnosis not present

## 2023-03-16 DIAGNOSIS — R918 Other nonspecific abnormal finding of lung field: Secondary | ICD-10-CM | POA: Diagnosis not present

## 2023-03-16 DIAGNOSIS — J432 Centrilobular emphysema: Secondary | ICD-10-CM | POA: Diagnosis not present

## 2023-03-18 ENCOUNTER — Other Ambulatory Visit: Payer: Self-pay | Admitting: Cardiology

## 2023-03-19 NOTE — Telephone Encounter (Signed)
Rx refill sent to pharmacy. 

## 2023-03-28 DIAGNOSIS — J449 Chronic obstructive pulmonary disease, unspecified: Secondary | ICD-10-CM | POA: Diagnosis not present

## 2023-03-31 DIAGNOSIS — J449 Chronic obstructive pulmonary disease, unspecified: Secondary | ICD-10-CM | POA: Diagnosis not present

## 2023-04-06 ENCOUNTER — Other Ambulatory Visit: Payer: Self-pay | Admitting: Cardiology

## 2023-04-25 DIAGNOSIS — M25511 Pain in right shoulder: Secondary | ICD-10-CM | POA: Diagnosis not present

## 2023-04-25 DIAGNOSIS — M79631 Pain in right forearm: Secondary | ICD-10-CM | POA: Diagnosis not present

## 2023-04-27 ENCOUNTER — Other Ambulatory Visit: Payer: Self-pay | Admitting: Cardiology

## 2023-04-27 DIAGNOSIS — J449 Chronic obstructive pulmonary disease, unspecified: Secondary | ICD-10-CM | POA: Diagnosis not present

## 2023-04-30 DIAGNOSIS — J449 Chronic obstructive pulmonary disease, unspecified: Secondary | ICD-10-CM | POA: Diagnosis not present

## 2023-05-05 ENCOUNTER — Other Ambulatory Visit: Payer: Self-pay | Admitting: Cardiology

## 2023-05-28 DIAGNOSIS — J449 Chronic obstructive pulmonary disease, unspecified: Secondary | ICD-10-CM | POA: Diagnosis not present

## 2023-05-29 ENCOUNTER — Other Ambulatory Visit: Payer: Self-pay | Admitting: Cardiology

## 2023-05-31 DIAGNOSIS — J449 Chronic obstructive pulmonary disease, unspecified: Secondary | ICD-10-CM | POA: Diagnosis not present

## 2023-06-08 DIAGNOSIS — Z0000 Encounter for general adult medical examination without abnormal findings: Secondary | ICD-10-CM | POA: Diagnosis not present

## 2023-06-08 DIAGNOSIS — Z23 Encounter for immunization: Secondary | ICD-10-CM | POA: Diagnosis not present

## 2023-06-08 DIAGNOSIS — Z9181 History of falling: Secondary | ICD-10-CM | POA: Diagnosis not present

## 2023-06-27 DIAGNOSIS — J449 Chronic obstructive pulmonary disease, unspecified: Secondary | ICD-10-CM | POA: Diagnosis not present

## 2023-06-30 DIAGNOSIS — J449 Chronic obstructive pulmonary disease, unspecified: Secondary | ICD-10-CM | POA: Diagnosis not present

## 2023-07-31 DIAGNOSIS — J449 Chronic obstructive pulmonary disease, unspecified: Secondary | ICD-10-CM | POA: Diagnosis not present

## 2023-08-31 DIAGNOSIS — J449 Chronic obstructive pulmonary disease, unspecified: Secondary | ICD-10-CM | POA: Diagnosis not present

## 2023-09-16 DIAGNOSIS — I10 Essential (primary) hypertension: Secondary | ICD-10-CM | POA: Diagnosis not present

## 2023-09-16 DIAGNOSIS — E1165 Type 2 diabetes mellitus with hyperglycemia: Secondary | ICD-10-CM | POA: Diagnosis not present

## 2023-09-16 DIAGNOSIS — K219 Gastro-esophageal reflux disease without esophagitis: Secondary | ICD-10-CM | POA: Diagnosis not present

## 2023-09-16 DIAGNOSIS — D696 Thrombocytopenia, unspecified: Secondary | ICD-10-CM | POA: Diagnosis not present

## 2023-09-23 ENCOUNTER — Encounter: Admitting: Podiatry

## 2023-09-23 NOTE — Progress Notes (Signed)
Patient did not show for scheduled appointment today.

## 2023-09-24 DIAGNOSIS — F17210 Nicotine dependence, cigarettes, uncomplicated: Secondary | ICD-10-CM | POA: Diagnosis not present

## 2023-09-24 DIAGNOSIS — Z743 Need for continuous supervision: Secondary | ICD-10-CM | POA: Diagnosis not present

## 2023-09-24 DIAGNOSIS — R9431 Abnormal electrocardiogram [ECG] [EKG]: Secondary | ICD-10-CM | POA: Diagnosis not present

## 2023-09-24 DIAGNOSIS — J441 Chronic obstructive pulmonary disease with (acute) exacerbation: Secondary | ICD-10-CM | POA: Diagnosis not present

## 2023-09-24 DIAGNOSIS — K219 Gastro-esophageal reflux disease without esophagitis: Secondary | ICD-10-CM | POA: Diagnosis not present

## 2023-09-24 DIAGNOSIS — Z794 Long term (current) use of insulin: Secondary | ICD-10-CM | POA: Diagnosis not present

## 2023-09-24 DIAGNOSIS — I10 Essential (primary) hypertension: Secondary | ICD-10-CM | POA: Diagnosis not present

## 2023-09-24 DIAGNOSIS — Z7952 Long term (current) use of systemic steroids: Secondary | ICD-10-CM | POA: Diagnosis not present

## 2023-09-24 DIAGNOSIS — R0602 Shortness of breath: Secondary | ICD-10-CM | POA: Diagnosis not present

## 2023-09-24 DIAGNOSIS — E7800 Pure hypercholesterolemia, unspecified: Secondary | ICD-10-CM | POA: Diagnosis not present

## 2023-09-24 DIAGNOSIS — E872 Acidosis, unspecified: Secondary | ICD-10-CM | POA: Diagnosis not present

## 2023-09-24 DIAGNOSIS — D696 Thrombocytopenia, unspecified: Secondary | ICD-10-CM | POA: Diagnosis not present

## 2023-09-24 DIAGNOSIS — E119 Type 2 diabetes mellitus without complications: Secondary | ICD-10-CM | POA: Diagnosis not present

## 2023-09-24 DIAGNOSIS — J449 Chronic obstructive pulmonary disease, unspecified: Secondary | ICD-10-CM | POA: Diagnosis not present

## 2023-09-24 DIAGNOSIS — Z7982 Long term (current) use of aspirin: Secondary | ICD-10-CM | POA: Diagnosis not present

## 2023-09-24 DIAGNOSIS — Z79899 Other long term (current) drug therapy: Secondary | ICD-10-CM | POA: Diagnosis not present

## 2023-09-24 DIAGNOSIS — E785 Hyperlipidemia, unspecified: Secondary | ICD-10-CM | POA: Diagnosis not present

## 2023-09-24 DIAGNOSIS — I4892 Unspecified atrial flutter: Secondary | ICD-10-CM | POA: Diagnosis not present

## 2023-09-24 DIAGNOSIS — R000 Tachycardia, unspecified: Secondary | ICD-10-CM | POA: Diagnosis not present

## 2023-09-24 DIAGNOSIS — J9621 Acute and chronic respiratory failure with hypoxia: Secondary | ICD-10-CM | POA: Diagnosis not present

## 2023-09-24 DIAGNOSIS — A419 Sepsis, unspecified organism: Secondary | ICD-10-CM | POA: Diagnosis not present

## 2023-09-24 DIAGNOSIS — J440 Chronic obstructive pulmonary disease with acute lower respiratory infection: Secondary | ICD-10-CM | POA: Diagnosis not present

## 2023-09-24 DIAGNOSIS — Z792 Long term (current) use of antibiotics: Secondary | ICD-10-CM | POA: Diagnosis not present

## 2023-09-24 DIAGNOSIS — R0902 Hypoxemia: Secondary | ICD-10-CM | POA: Diagnosis not present

## 2023-09-28 DIAGNOSIS — J449 Chronic obstructive pulmonary disease, unspecified: Secondary | ICD-10-CM | POA: Diagnosis not present

## 2023-10-27 ENCOUNTER — Ambulatory Visit (INDEPENDENT_AMBULATORY_CARE_PROVIDER_SITE_OTHER): Admitting: Podiatry

## 2023-10-27 DIAGNOSIS — M79675 Pain in left toe(s): Secondary | ICD-10-CM

## 2023-10-27 DIAGNOSIS — B351 Tinea unguium: Secondary | ICD-10-CM | POA: Diagnosis not present

## 2023-10-27 DIAGNOSIS — M79674 Pain in right toe(s): Secondary | ICD-10-CM

## 2023-10-27 DIAGNOSIS — E1151 Type 2 diabetes mellitus with diabetic peripheral angiopathy without gangrene: Secondary | ICD-10-CM | POA: Diagnosis not present

## 2023-10-27 DIAGNOSIS — L84 Corns and callosities: Secondary | ICD-10-CM

## 2023-10-27 NOTE — Progress Notes (Signed)
 Subjective:  Patient ID: Scott Figueroa, male    DOB: [birth year],  MRN: [id number]  Scott Figueroa presents to clinic today for:  Chief Complaint  Patient presents with   Ingrown Toenail    Bilateral 1st toes, bilateral borders. All nails are extremely long and thick. States it has been months since he last cut his nails. They are also yellow. No recent A1c in Chart and he does not know what it was, and does  not monitor his BS. He does take ASA 81   Patient notes nails are thick and elongated, causing pain in shoe gear when ambulating.  He has a painful callus underneath the left foot.  PCP is Scott Jan., MD. last seen 06/08/2023  Past Medical History:  Diagnosis Date   ARF (acute renal failure) (H[facility]) 01/08/2015   Arthritis    Asthma    Cataract    COPD (chronic obstructive pulmonary disease) (H[facility])    Diabetes mellitus without complication (HCC)    Gastroesophageal reflux disease without esophagitis    Gout    Heart disease    Hyperlipidemia    Hypertension    Mixed dyslipidemia 08/26/2016   Myocardial infarction [facility])    Osteoarthritis    Oxygen  deficiency    uses Oxygen  PRN   Type 2 diabetes mellitus with ketoacidosis without coma (HCC)    Vitamin D deficiency 08/26/2016    No Known Allergies  Objective:  Scott Figueroa is a pleasant 73 y.o. male in NAD. AAO x 3.  Vascular Examination: Patient has palpable DP pulse, absent PT pulse bilateral.  Delayed capillary refill bilateral toes.  Sparse digital hair bilateral.  Proximal to distal cooling WNL bilateral.  +1 pitting edema bilateral  Dermatological Examination: Interspaces are clear with no open lesions noted bilateral.  Skin is shiny and atrophic bilateral.  Nails are 5-19mm thick, with yellowish/brown discoloration, subungual debris and distal onycholysis x10.  The medial borders of the nail do curling on the bilateral hallux but there is no sign of paronychia or surrounding erythema.  There is pain  with compression of nails x10.  They are all elongated.  There are hyperkeratotic lesions noted left submet 5.  Patient qualifies for at-risk foot care because of diabetes with PVD.  Assessment/Plan: 1. Pain due to onychomycosis of toenails of both feet   2. Callus of foot   3. Type II diabetes mellitus with peripheral circulatory disorder (HCC)     Mycotic nails x10 were sharply debrided with sterile nail nippers and power debriding burr to decrease bulk and length.  Hyperkeratotic lesion left submet 5 was shaved with #312 blade.  Informed the patient that the feeling of the ingrown nails may be due to the significant thickness and length of the nails today.  Will see if he continues to have any pain after the debridement had been performed today.  Since his pulses are nonpalpable to the feet, if we need to proceed with any type of toenail avulsion, he would need to be sent for noninvasive vascular studies prior to his procedure appointment.  He expressed understanding.   Return in about 3 months (around 01/26/2024) for [facility].   Joe Murders, DPM, FACFAS [facility]     [address]  [place], [place] [address]                Office [phone]  Fax [phone]

## 2023-10-29 DIAGNOSIS — J449 Chronic obstructive pulmonary disease, unspecified: Secondary | ICD-10-CM | POA: Diagnosis not present

## 2023-11-28 DIAGNOSIS — J449 Chronic obstructive pulmonary disease, unspecified: Secondary | ICD-10-CM | POA: Diagnosis not present

## 2023-12-29 DIAGNOSIS — J449 Chronic obstructive pulmonary disease, unspecified: Secondary | ICD-10-CM | POA: Diagnosis not present

## 2024-01-25 DIAGNOSIS — M47815 Spondylosis without myelopathy or radiculopathy, thoracolumbar region: Secondary | ICD-10-CM | POA: Diagnosis not present

## 2024-01-25 DIAGNOSIS — I38 Endocarditis, valve unspecified: Secondary | ICD-10-CM | POA: Diagnosis not present

## 2024-01-25 DIAGNOSIS — E1165 Type 2 diabetes mellitus with hyperglycemia: Secondary | ICD-10-CM | POA: Diagnosis not present

## 2024-01-25 DIAGNOSIS — K219 Gastro-esophageal reflux disease without esophagitis: Secondary | ICD-10-CM | POA: Diagnosis not present

## 2024-01-25 DIAGNOSIS — I10 Essential (primary) hypertension: Secondary | ICD-10-CM | POA: Diagnosis not present

## 2024-01-25 DIAGNOSIS — J449 Chronic obstructive pulmonary disease, unspecified: Secondary | ICD-10-CM | POA: Diagnosis not present

## 2024-01-25 DIAGNOSIS — D696 Thrombocytopenia, unspecified: Secondary | ICD-10-CM | POA: Diagnosis not present

## 2024-01-26 ENCOUNTER — Ambulatory Visit (INDEPENDENT_AMBULATORY_CARE_PROVIDER_SITE_OTHER): Admitting: Podiatry

## 2024-01-26 DIAGNOSIS — B351 Tinea unguium: Secondary | ICD-10-CM | POA: Diagnosis not present

## 2024-01-26 DIAGNOSIS — M79674 Pain in right toe(s): Secondary | ICD-10-CM

## 2024-01-26 DIAGNOSIS — M79675 Pain in left toe(s): Secondary | ICD-10-CM

## 2024-01-26 NOTE — Progress Notes (Signed)
    Subjective:  Patient ID: Scott Figueroa, male    DOB: 17-Sep-1950,  MRN: [id number]  Scott Figueroa presents to clinic today for:  Chief Complaint  Patient presents with   [facility]    Last A1c: 12.0. Takes ASA 81 mg. Denies corns/ callus.    Patient notes nails are thick, discolored, elongated and painful in shoegear when trying to ambulate.  Notes he is going fishing after his appointment today.   PCP is Elaine Garnette BIRCH., MD.  Last seen yesterday.   Past Medical History:  Diagnosis Date   ARF (acute renal failure) (HCC) 01/08/2015   Arthritis    Asthma    Cataract    COPD (chronic obstructive pulmonary disease) (HCC)    Diabetes mellitus without complication (HCC)    Gastroesophageal reflux disease without esophagitis    Gout    Heart disease    Hyperlipidemia    Hypertension    Mixed dyslipidemia 08/26/2016   Myocardial infarction [facility])    Osteoarthritis    Oxygen  deficiency    uses Oxygen  PRN   Type 2 diabetes mellitus with ketoacidosis without coma (HCC)    Vitamin D deficiency 08/26/2016   Past Surgical History:  Procedure Laterality Date   COLONOSCOPY  02/17/2013   Colon polyp-status post polpectomy. Small internal hemorrhoids. Otherwise grossly normal colonoscopy to cecum   left arm surgery     left knee surgery     No Known Allergies  Review of Systems: Negative except as noted in the HPI.  Objective:  Scott Figueroa is a pleasant 73 y.o. male in NAD. AAO x 3.  Vascular Examination: Capillary refill time is 3-5 seconds to toes bilateral. Palpable pedal pulses b/l LE. Digital hair present b/l.  Skin temperature gradient WNL b/l. No varicosities b/l. No cyanosis noted b/l.   Dermatological Examination: Pedal skin with normal turgor, texture and tone b/l. No open wounds. No interdigital macerations b/l. Toenails x10 are 3mm thick, discolored, dystrophic with subungual debris. There is pain with compression of the nail plates.  They are elongated  x10  Assessment/Plan: 1. Pain due to onychomycosis of toenails of both feet    The mycotic toenails were sharply debrided x10 with sterile nail nippers and a power debriding burr to decrease bulk/thickness and length.    Return in about 3 months (around 04/27/2024) for [facility].   Awanda CHARM [place], DPM, FACFAS [facility]     [address] [place], [place] [address]                Office [phone]  Fax [phone]

## 2024-01-28 DIAGNOSIS — J449 Chronic obstructive pulmonary disease, unspecified: Secondary | ICD-10-CM | POA: Diagnosis not present

## 2024-02-23 DIAGNOSIS — R7401 Elevation of levels of liver transaminase levels: Secondary | ICD-10-CM | POA: Diagnosis not present

## 2024-02-25 DIAGNOSIS — R748 Abnormal levels of other serum enzymes: Secondary | ICD-10-CM | POA: Diagnosis not present

## 2024-02-28 DIAGNOSIS — J449 Chronic obstructive pulmonary disease, unspecified: Secondary | ICD-10-CM | POA: Diagnosis not present

## 2024-04-26 ENCOUNTER — Ambulatory Visit: Admitting: Podiatry

## 2024-04-29 DIAGNOSIS — J449 Chronic obstructive pulmonary disease, unspecified: Secondary | ICD-10-CM | POA: Diagnosis not present

## 2024-05-10 ENCOUNTER — Ambulatory Visit (INDEPENDENT_AMBULATORY_CARE_PROVIDER_SITE_OTHER): Admitting: Podiatry

## 2024-05-10 DIAGNOSIS — B351 Tinea unguium: Secondary | ICD-10-CM | POA: Diagnosis not present

## 2024-05-10 DIAGNOSIS — L853 Xerosis cutis: Secondary | ICD-10-CM

## 2024-05-10 DIAGNOSIS — M79675 Pain in left toe(s): Secondary | ICD-10-CM | POA: Diagnosis not present

## 2024-05-10 DIAGNOSIS — M79674 Pain in right toe(s): Secondary | ICD-10-CM

## 2024-05-10 DIAGNOSIS — E1151 Type 2 diabetes mellitus with diabetic peripheral angiopathy without gangrene: Secondary | ICD-10-CM

## 2024-05-10 MED ORDER — AMMONIUM LACTATE 12 % EX LOTN: 1.0000 | TOPICAL_LOTION | CUTANEOUS | 5 refills | Status: AC | PRN

## 2024-05-10 NOTE — Progress Notes (Signed)
    Subjective:  Patient ID: Scott Figueroa, male    DOB: 09-21-1950,  MRN: [id number]  Scott Figueroa presents to clinic today for:  Chief Complaint  Patient presents with   RFC    RFC, hard to tell if there are callous due to extremely dry skin Not diabetic ASA   Patient notes nails are thick, discolored, elongated and painful in shoegear when trying to ambulate.  Notes he is going fishing after his appointment today.   PCP is Elaine Garnette BIRCH., MD.  Last seen around 02/01/24  Past Medical History:  Diagnosis Date   ARF (acute renal failure) 01/08/2015   Arthritis    Asthma    Cataract    COPD (chronic obstructive pulmonary disease) (HCC)    Diabetes mellitus without complication (HCC)    Gastroesophageal reflux disease without esophagitis    Gout    Heart disease    Hyperlipidemia    Hypertension    Mixed dyslipidemia 08/26/2016   Myocardial infarction [facility])    Osteoarthritis    Oxygen  deficiency    uses Oxygen  PRN   Type 2 diabetes mellitus with ketoacidosis without coma (HCC)    Vitamin D deficiency 08/26/2016   Past Surgical History:  Procedure Laterality Date   COLONOSCOPY  02/17/2013   Colon polyp-status post polpectomy. Small internal hemorrhoids. Otherwise grossly normal colonoscopy to cecum   left arm surgery     left knee surgery     No Known Allergies  Review of Systems: Negative except as noted in the HPI.  Objective:  Scott Figueroa is a pleasant 73 y.o. male in NAD. AAO x 3.  Vascular Examination: Capillary refill time is 3-5 seconds to toes bilateral.  Trace palpable pedal pulses b/l LE. Digital hair absent b/l.  Skin temperature gradient WNL b/l. No varicosities b/l. No cyanosis noted b/l.  +1 pitting edema bilateral lower legs and ankles.  Dermatological Examination: Pedal skin very dry and scaly b/l. No open wounds. No interdigital macerations b/l. Toenails x10 are 3mm thick, discolored, dystrophic with subungual debris. There is pain with  compression of the nail plates.  They are elongated x10  Assessment/Plan: 1. Pain due to onychomycosis of toenails of both feet   2. Type II diabetes mellitus with peripheral circulatory disorder (HCC)   3. Xerosis of skin    The mycotic toenails were sharply debrided x10 with sterile nail nippers and a power debriding burr to decrease bulk/thickness and length.    Prescription for Lac-Hydrin to percent lotion was sent to his pharmacy to begin applying once daily to the dry skin on his feet.  Return in about 3 months (around 08/10/2024) for RFC.   Awanda CHARM [place], DPM, FACFAS [facility]     [address] [place], [place] [address]                Office [phone]  Fax [phone]

## 2024-05-12 DIAGNOSIS — E119 Type 2 diabetes mellitus without complications: Secondary | ICD-10-CM | POA: Diagnosis not present

## 2024-08-09 ENCOUNTER — Encounter: Admitting: Podiatry

## 2024-08-09 NOTE — Progress Notes (Signed)
Patient did not show for scheduled appointment today.
